# Patient Record
Sex: Female | Born: 1970 | Race: White | Hispanic: Yes | Marital: Married | Smoking: Never smoker
Health system: Southern US, Community
[De-identification: ages and names within clinical notes are randomized; demographics above are authoritative.]

## PROBLEM LIST (undated history)

## (undated) DIAGNOSIS — F32A Depression, unspecified: Secondary | ICD-10-CM

## (undated) DIAGNOSIS — E282 Polycystic ovarian syndrome: Secondary | ICD-10-CM

## (undated) DIAGNOSIS — K635 Polyp of colon: Secondary | ICD-10-CM

## (undated) DIAGNOSIS — F329 Major depressive disorder, single episode, unspecified: Secondary | ICD-10-CM

## (undated) HISTORY — DX: Polyp of colon: K63.5

## (undated) HISTORY — DX: Polycystic ovarian syndrome: E28.2

## (undated) HISTORY — PX: WISDOM TOOTH EXTRACTION: SHX21

---

## 1990-07-26 HISTORY — PX: CRYOTHERAPY: SHX1416

## 1991-07-27 HISTORY — PX: REDUCTION MAMMAPLASTY: SUR839

## 2005-03-19 ENCOUNTER — Inpatient Hospital Stay: Payer: Self-pay

## 2005-03-19 ENCOUNTER — Observation Stay: Payer: Self-pay

## 2007-11-28 ENCOUNTER — Ambulatory Visit: Payer: Self-pay | Admitting: Family Medicine

## 2009-05-26 ENCOUNTER — Ambulatory Visit: Payer: Self-pay

## 2009-12-15 ENCOUNTER — Encounter: Payer: Self-pay | Admitting: Otolaryngology

## 2009-12-24 ENCOUNTER — Encounter: Payer: Self-pay | Admitting: Otolaryngology

## 2010-01-23 ENCOUNTER — Encounter: Payer: Self-pay | Admitting: Otolaryngology

## 2010-06-01 ENCOUNTER — Ambulatory Visit: Payer: Self-pay

## 2011-06-07 ENCOUNTER — Ambulatory Visit: Payer: Self-pay

## 2012-06-07 ENCOUNTER — Ambulatory Visit: Payer: Self-pay

## 2015-07-27 DIAGNOSIS — K635 Polyp of colon: Secondary | ICD-10-CM

## 2015-07-27 HISTORY — DX: Polyp of colon: K63.5

## 2015-11-11 ENCOUNTER — Ambulatory Visit: Payer: Self-pay | Admitting: Physical Therapy

## 2015-11-13 ENCOUNTER — Ambulatory Visit: Payer: Managed Care, Other (non HMO) | Admitting: Physical Therapy

## 2015-11-25 ENCOUNTER — Encounter: Payer: Self-pay | Admitting: *Deleted

## 2015-11-26 ENCOUNTER — Encounter
Admission: RE | Disposition: A | Discharge status: Home / Self Care | Payer: Self-pay | Source: Ambulatory Visit | Attending: Unknown Physician Specialty

## 2015-11-26 ENCOUNTER — Ambulatory Visit: Payer: Managed Care, Other (non HMO) | Admitting: Anesthesiology

## 2015-11-26 ENCOUNTER — Ambulatory Visit
Admission: RE | Admit: 2015-11-26 | Discharge: 2015-11-26 | Disposition: A | Discharge status: Home / Self Care | Payer: Managed Care, Other (non HMO) | Source: Ambulatory Visit | Attending: Unknown Physician Specialty | Admitting: Unknown Physician Specialty

## 2015-11-26 ENCOUNTER — Encounter: Payer: Self-pay | Admitting: *Deleted

## 2015-11-26 DIAGNOSIS — Z800 Family history of malignant neoplasm of digestive organs: Secondary | ICD-10-CM | POA: Diagnosis not present

## 2015-11-26 DIAGNOSIS — D123 Benign neoplasm of transverse colon: Secondary | ICD-10-CM | POA: Insufficient documentation

## 2015-11-26 DIAGNOSIS — K529 Noninfective gastroenteritis and colitis, unspecified: Secondary | ICD-10-CM | POA: Insufficient documentation

## 2015-11-26 DIAGNOSIS — Z1211 Encounter for screening for malignant neoplasm of colon: Secondary | ICD-10-CM | POA: Insufficient documentation

## 2015-11-26 DIAGNOSIS — K633 Ulcer of intestine: Secondary | ICD-10-CM | POA: Insufficient documentation

## 2015-11-26 HISTORY — DX: Depression, unspecified: F32.A

## 2015-11-26 HISTORY — DX: Major depressive disorder, single episode, unspecified: F32.9

## 2015-11-26 HISTORY — PX: COLONOSCOPY WITH PROPOFOL: SHX5780

## 2015-11-26 SURGERY — COLONOSCOPY WITH PROPOFOL
Anesthesia: General

## 2015-11-26 MED ORDER — SODIUM CHLORIDE 0.9 % IV SOLN
INTRAVENOUS | Status: DC
  Administered 2015-11-26: 09:00:00 via INTRAVENOUS

## 2015-11-26 MED ORDER — MIDAZOLAM HCL 2 MG/2ML IJ SOLN
INTRAMUSCULAR | Status: DC | PRN
  Administered 2015-11-26: 1 mg via INTRAVENOUS

## 2015-11-26 MED ORDER — PROPOFOL 500 MG/50ML IV EMUL
INTRAVENOUS | Status: DC | PRN
  Administered 2015-11-26: 120 ug/kg/min via INTRAVENOUS

## 2015-11-26 MED ORDER — FENTANYL CITRATE (PF) 100 MCG/2ML IJ SOLN
INTRAMUSCULAR | Status: DC | PRN
  Administered 2015-11-26: 50 ug via INTRAVENOUS

## 2015-11-26 MED ORDER — SODIUM CHLORIDE 0.9 % IV SOLN: INTRAVENOUS | Status: DC

## 2015-11-26 MED ORDER — EPHEDRINE SULFATE 50 MG/ML IJ SOLN
INTRAMUSCULAR | Status: DC | PRN
  Administered 2015-11-26 (×2): 5 mg via INTRAVENOUS

## 2015-11-26 NOTE — Anesthesia Procedure Notes (Signed)
Performed by: COOK-MARTIN, Aleksis Jiggetts Pre-anesthesia Checklist: Patient identified, Emergency Drugs available, Suction available, Patient being monitored and Timeout performed Patient Re-evaluated:Patient Re-evaluated prior to inductionOxygen Delivery Method: Nasal cannula Preoxygenation: Pre-oxygenation with 100% oxygen Intubation Type: IV induction Placement Confirmation: positive ETCO2 and CO2 detector       

## 2015-11-26 NOTE — Anesthesia Postprocedure Evaluation (Signed)
Anesthesia Post Note  Patient: Christine Wade  Procedure(s) Performed: Procedure(s) (LRB): COLONOSCOPY WITH PROPOFOL (N/A)  Patient location during evaluation: Endoscopy Anesthesia Type: General Level of consciousness: awake and alert Pain management: pain level controlled Vital Signs Assessment: post-procedure vital signs reviewed and stable Respiratory status: spontaneous breathing, nonlabored ventilation, respiratory function stable and patient connected to nasal cannula oxygen Cardiovascular status: blood pressure returned to baseline and stable Postop Assessment: no signs of nausea or vomiting Anesthetic complications: no    Last Vitals:  Filed Vitals:   11/26/15 1023 11/26/15 1033  BP: 116/70 124/68  Pulse: 73 97  Temp:    Resp: 16 21    Last Pain: There were no vitals filed for this visit.               Martha Clan

## 2015-11-26 NOTE — H&P (Signed)
   Primary Care Physician:  Dalia Heading, CNM Primary Gastroenterologist:  Dr. Vira Agar  Pre-Procedure History & Physical: HPI:  Christine Wade is a 45 y.o. female is here for an colonoscopy.   Past Medical History  Diagnosis Date  . Medical history non-contributory   . Depression     Past Surgical History  Procedure Laterality Date  . No past surgeries    . Breast surgery  1993    reduction mammoplasty  . Wisdom tooth extraction      Prior to Admission medications   Medication Sig Start Date End Date Taking? Authorizing Provider  meloxicam (MOBIC) 7.5 MG tablet Take 7.5 mg by mouth daily.   Yes Historical Provider, MD  Multiple Vitamin (MULTIVITAMIN) tablet Take 1 tablet by mouth daily.   Yes Historical Provider, MD    Allergies as of 11/25/2015  . (No Known Allergies)    History reviewed. No pertinent family history.  Social History   Social History  . Marital Status: Married    Spouse Name: N/A  . Number of Children: N/A  . Years of Education: N/A   Occupational History  . Not on file.   Social History Main Topics  . Smoking status: Never Smoker   . Smokeless tobacco: Not on file  . Alcohol Use: Yes  . Drug Use: No  . Sexual Activity: Not on file   Other Topics Concern  . Not on file   Social History Narrative    Review of Systems: See HPI, otherwise negative ROS  Physical Exam: BP 115/60 mmHg  Pulse 80  Temp(Src) 98.5 F (36.9 C) (Tympanic)  Resp 14  Ht 5\' 6"  (1.676 m)  Wt 67.132 kg (148 lb)  BMI 23.90 kg/m2  SpO2 100%  LMP  General:   Alert,  pleasant and cooperative in NAD Head:  Normocephalic and atraumatic. Neck:  Supple; no masses or thyromegaly. Lungs:  Clear throughout to auscultation.    Heart:  Regular rate and rhythm. Abdomen:  Soft, nontender and nondistended. Normal bowel sounds, without guarding, and without rebound.   Neurologic:  Alert and  oriented x4;  grossly normal neurologically.  Impression/Plan: Christine Wade is here for an colonoscopy to be performed for screening, family hx of colon cancer in father and colon polyps in twin sister.  Risks, benefits, limitations, and alternatives regarding  colonoscopy have been reviewed with the patient.  Questions have been answered.  All parties agreeable.   Gaylyn Cheers, MD  11/26/2015, 9:17 AM

## 2015-11-26 NOTE — Anesthesia Preprocedure Evaluation (Signed)
Anesthesia Evaluation  Patient identified by MRN, date of birth, ID band Patient awake    Reviewed: Allergy & Precautions, H&P , NPO status , Patient's Chart, lab work & pertinent test results, reviewed documented beta blocker date and time   History of Anesthesia Complications Negative for: history of anesthetic complications  Airway Mallampati: II  TM Distance: >3 FB Neck ROM: full    Dental no notable dental hx. (+) Caps, Missing, Teeth Intact Bridge on the top:   Pulmonary neg pulmonary ROS,    Pulmonary exam normal breath sounds clear to auscultation       Cardiovascular Exercise Tolerance: Good negative cardio ROS Normal cardiovascular exam Rhythm:regular Rate:Normal     Neuro/Psych negative neurological ROS  negative psych ROS   GI/Hepatic negative GI ROS, Neg liver ROS,   Endo/Other  negative endocrine ROS  Renal/GU negative Renal ROS  negative genitourinary   Musculoskeletal   Abdominal   Peds  Hematology negative hematology ROS (+)   Anesthesia Other Findings Past Medical History:   Medical history non-contributory                             Depression                                                   Reproductive/Obstetrics negative OB ROS                             Anesthesia Physical Anesthesia Plan  ASA: I  Anesthesia Plan: General   Post-op Pain Management:    Induction:   Airway Management Planned:   Additional Equipment:   Intra-op Plan:   Post-operative Plan:   Informed Consent: I have reviewed the patients History and Physical, chart, labs and discussed the procedure including the risks, benefits and alternatives for the proposed anesthesia with the patient or authorized representative who has indicated his/her understanding and acceptance.   Dental Advisory Given  Plan Discussed with: Anesthesiologist, CRNA and Surgeon  Anesthesia Plan Comments:          Anesthesia Quick Evaluation

## 2015-11-26 NOTE — Op Note (Signed)
[facility] Gastroenterology Patient Name: Christine Wade Procedure Date: 11/26/2015 9:18 AM MRN: [id number] Account #: [id number] Date of Birth: 1971-03-22 Admit Type: Outpatient Age: 45 Room: [facility] ENDO ROOM 4 Gender: Female Note Status: Finalized Procedure:            Colonoscopy Indications:          Screening in patient at increased risk: Family history                        of 1st-degree relative with colorectal cancer Providers:            Manya Silvas, MD Referring MD:         Jesus Genera. Danise Mina (Referring MD) Medicines:            Propofol per Anesthesia Complications:        No immediate complications. Procedure:            Pre-Anesthesia Assessment:                       - After reviewing the risks and benefits, the patient                        was deemed in satisfactory condition to undergo the                        procedure.                       After obtaining informed consent, the colonoscope was                        passed under direct vision. Throughout the procedure,                        the patient's blood pressure, pulse, and oxygen                        saturations were monitored continuously. The                        Colonoscope was introduced through the anus and                        advanced to the the cecum, identified by appendiceal                        orifice and ileocecal valve. Findings:      The distal ileum contained 2 small ulcers. No bleeding was present. No       stigmata of recent bleeding were seen. Biopsies were taken with a cold       forceps for histology.      A diffuse area of granular mucosa was found at the ileocecal valve.       Biopsies were taken with a cold forceps for histology.      A small polyp was found in the transverse colon. The polyp was sessile.       The polyp was removed with a hot snare. Resection and retrieval were       complete. One clip applied.      A small polyp was found in  the hepatic flexure. The polyp  was sessile.       The polyp was removed with a hot snare. Resection and retrieval were       complete.      A small polyp was found in the transverse colon. The polyp was sessile.       The polyp was removed with a hot snare. Resection and retrieval were       complete.      A localized area of granular mucosa was found in the descending colon.       Biopsies were taken with a cold forceps for histology. Impression:           - 2 small ulcers in the distal ileum. Biopsied.                       - Granularity at the ileocecal valve. Biopsied.                       - One small polyp in the transverse colon, removed with                        a hot snare. Resected and retrieved.                       - One small polyp at the hepatic flexure, removed with                        a hot snare. Resected and retrieved.                       - One small polyp in the transverse colon, removed with                        a hot snare. Resected and retrieved.                       - Granularity in the descending colon. Biopsied. Recommendation:       - Await pathology results. Manya Silvas, MD 11/26/2015 10:03:44 AM This report has been signed electronically. Number of Addenda: 0 Note Initiated On: 11/26/2015 9:18 AM Scope Withdrawal Time: 0 hours 24 minutes 25 seconds  Total Procedure Duration: 0 hours 32 minutes 35 seconds       [facility]

## 2015-11-26 NOTE — Transfer of Care (Signed)
Immediate Anesthesia Transfer of Care Note  Patient: Christine Wade  Procedure(s) Performed: Procedure(s): COLONOSCOPY WITH PROPOFOL (N/A)  Patient Location: PACU  Anesthesia Type:General  Level of Consciousness: awake, alert  and sedated  Airway & Oxygen Therapy: Patient Spontanous Breathing and Patient connected to nasal cannula oxygen  Post-op Assessment: Report given to RN  Post vital signs: Reviewed  Last Vitals:  Filed Vitals:   11/26/15 0825  BP: 115/60  Pulse: 80  Temp: 36.9 C  Resp: 14    Last Pain: There were no vitals filed for this visit.       Complications: No apparent anesthesia complications

## 2015-11-28 ENCOUNTER — Encounter: Payer: Self-pay | Admitting: Unknown Physician Specialty

## 2015-11-28 LAB — SURGICAL PATHOLOGY

## 2016-10-13 ENCOUNTER — Ambulatory Visit: Payer: Self-pay | Admitting: Certified Nurse Midwife

## 2016-11-01 ENCOUNTER — Encounter: Payer: Self-pay | Admitting: Certified Nurse Midwife

## 2016-11-01 ENCOUNTER — Ambulatory Visit (INDEPENDENT_AMBULATORY_CARE_PROVIDER_SITE_OTHER): Payer: BLUE CROSS/BLUE SHIELD | Admitting: Certified Nurse Midwife

## 2016-11-01 VITALS — BP 110/70 | HR 81 | Ht 66.0 in | Wt 150.0 lb

## 2016-11-01 DIAGNOSIS — F320 Major depressive disorder, single episode, mild: Secondary | ICD-10-CM | POA: Diagnosis not present

## 2016-11-01 DIAGNOSIS — Z124 Encounter for screening for malignant neoplasm of cervix: Secondary | ICD-10-CM | POA: Diagnosis not present

## 2016-11-01 DIAGNOSIS — Z01419 Encounter for gynecological examination (general) (routine) without abnormal findings: Secondary | ICD-10-CM

## 2016-11-01 DIAGNOSIS — F32A Depression, unspecified: Secondary | ICD-10-CM

## 2016-11-01 NOTE — Progress Notes (Signed)
Gynecology Annual Exam  PCP: Dalia Heading, CNM  Chief Complaint:  Chief Complaint  Patient presents with  . Gynecologic Exam    History of Present Illness Christine Wade is a 46 year old  Hispanic or Latino (from [place] female, G5 Z6109, who presents for her annual exam. She is having problems with anxiety, depression, and insomnia x 6 months. This began after the death of her father in May 30, 2023. She also stopped home schooling around the same time. She states she does better when her day is organized and purposeful. Has done some teacher substitution on occasion. Has a past history of depression and was prescribed a antidepressant by her PCP. She stopped it after a while because it made her "feel numb." She does not want to take any medications. She has never seen a counselor for depression.   Her menses are regular and her LMP was 10/24/2016 . They occur every 28 days , they last 5 days , are medium flow, and are without clots.  Her last menses was  A little heavier and painful than normal. She has had no spotting.  She reports mild dysmenorrhea.   The patient's past medical history is detailed in the past medical history section.  Since her last annual GYN exam dated 10/03/2015, she has had a colonoscopy May 2017 which revealed colon polyps and ileitis. She is to have her next colonoscopy at age 28. She has continued to have some problems with knee pain. She does do stretches to help with her knee pain. She is sexually active. Husband having some problems with ED. She is currently using a vasectomy for contraception.  Her most recent pap smear was obtained 10/03/2015 and was NIL/negative HRHPV  Her most recent mammogram obtained on 10/03/2015 was benign and revealed no significant changes.  There is no family history of breast cancer.  There is no family history of ovarian cancer. Her father was diagnosed with colon cancer at age 70. Her twin sister had colonoscopies for bowel  changes x2 and had polyps. The patient does not do self breast exams.  The patient does not smoke.  The patient does drink occasionally.  The patient does not use illegal drugs.  The patient exercises on the elliptical trainer 3 x/week.  The patient does not get adequate calcium in her diet.  She had a recent cholesterol screen last year which was normal.    Review of Systems: Review of Systems  Constitutional: Negative for chills, fever and weight loss.  HENT: Negative for congestion, sinus pain and sore throat.   Eyes: Negative for blurred vision and pain.  Respiratory: Negative for hemoptysis, shortness of breath and wheezing.   Cardiovascular: Negative for chest pain, palpitations and leg swelling.  Gastrointestinal: Negative for abdominal pain, blood in stool, diarrhea, heartburn, nausea and vomiting.  Genitourinary: Negative for dysuria, frequency, hematuria and urgency.       Negative for incontinence, irregular menses.  Musculoskeletal: Negative for back pain, joint pain and myalgias.  Skin: Negative for itching and rash.  Neurological: Negative for dizziness, tingling and headaches.  Endo/Heme/Allergies: Negative for environmental allergies and polydipsia. Does not bruise/bleed easily.       Negative for hirsutism and hot flashes   Psychiatric/Behavioral: Positive for depression. The patient is nervous/anxious and has insomnia.      Past Medical History:  Past Medical History:  Diagnosis Date  . Colon polyps 2017  . Depression   . Polycystic ovaries     Past  Surgical History:  Past Surgical History:  Procedure Laterality Date  . BREAST SURGERY  1993   reduction mammoplasty  . COLONOSCOPY WITH PROPOFOL N/A 11/26/2015   Procedure: COLONOSCOPY WITH PROPOFOL;  Surgeon: Manya Silvas, MD;  Location: [facility] ENDOSCOPY;  Service: Endoscopy;  Laterality: N/A;  . CRYOTHERAPY  1992  . WISDOM TOOTH EXTRACTION      Medications: calcium with vitamin D3, vitamin for skin,  hair, nails  Allergies:  No Known Allergies  Gynecologic History: Remote history of abnormal Pap smears with cryosurgery 1992 Obstetric History: 4 vaginal deliveries  Social History:  Social History   Social History  . Marital status: Married    Spouse name: N/A  . Number of children: 4  . Years of education: 101   Occupational History  . Not on file.   Social History Main Topics  . Smoking status: Never Smoker  . Smokeless tobacco: Never Used  . Alcohol use Yes  . Drug use: No  . Sexual activity: Yes    Partners: Male    Birth control/ protection: Other-see comments     Comment: vasectomy   Other Topics Concern  . Not on file   Social History Narrative  . No narrative on file    Family History:  Family History  Problem Relation Age of Onset  . Osteopenia Mother   . Colon cancer Father 10  . Colon polyps Sister   . Stomach cancer Paternal Grandfather   . Brain cancer Cousin   . Thyroid cancer Cousin      Physical Exam Vitals: BP 110/70   Pulse 81   Ht 5\' 6"  (1.676 m)   Wt 68 kg (150 lb)   LMP 10/24/2016 (Exact Date)   BMI 24.21 kg/m  Physical Exam  Constitutional: She is oriented to person, place, and time. She appears well-developed and well-nourished.  HENT:  Head: Normocephalic and atraumatic.  Neck: Normal range of motion. Neck supple. No thyromegaly present.  Cardiovascular: Normal rate and regular rhythm.   No murmur heard. Respiratory: Effort normal and breath sounds normal. She has no wheezes. She has no rales. She exhibits no tenderness. Right breast exhibits no inverted nipple, no mass, no nipple discharge, no skin change and no tenderness. Left breast exhibits no inverted nipple, no mass, no nipple discharge, no skin change and no tenderness.  s/p reduction mammoplasty  GI: Soft. She exhibits no distension and no mass. There is no tenderness. There is no rebound and no guarding.  Genitourinary: Rectum normal. Cervix exhibits no motion  tenderness, no discharge and no friability. Right adnexum displays no mass and no tenderness. Left adnexum displays no mass and no tenderness.  Genitourinary Comments: Vulva: no lesions or inflammation Vagina: Urethrocele (asymptomatic), no bleeding Cervix: friable os with Pap, NT Uterus: nonenlarged, normal contour Position: anteverted   mobile, non-tender  Adnexa: no masses, NT Rectal: external,non inflamed hemorrhoid  Musculoskeletal: Normal range of motion.  Lymphadenopathy:    She has no cervical adenopathy.    She has no axillary adenopathy.       Right: No supraclavicular adenopathy present.       Left: No supraclavicular adenopathy present.  No infraclavicular adenopathy  Neurological: She is alert and oriented to person, place, and time.  Skin: Skin is warm and dry. No rash noted.  Psychiatric: She has a normal mood and affect. Her behavior is normal. Judgment and thought content normal.     Assessment: 46 y.o.  well woman exam Mild depression/ anxiety  surrounding death of father and life changes ( no longer home schooling)   Plan: 1. Screening for cervical cancer: PAp done. 2) screening for breast cancer: Patient to schedule annual screening mammogram at [facility]. Encouraged self breast exams. 3). Next colonoscopy in 2 more years. 4) Follow up in 1 year for annual. 5). Routine screening for cholesterol and diabetes at age 44. Dalia Heading, CNM  11/01/2016 1:03 PM

## 2016-11-01 NOTE — Progress Notes (Deleted)
     Gynecology Annual Exam  PCP: Dalia Heading, CNM  Chief Complaint:  Chief Complaint  Patient presents with  . Gynecologic Exam   Review of Systems: ROS  Past Medical History:  Past Medical History:  Diagnosis Date  . Depression   . Polycystic ovaries     Past Surgical History:  Past Surgical History:  Procedure Laterality Date  . BREAST SURGERY  1993   reduction mammoplasty  . COLONOSCOPY WITH PROPOFOL N/A 11/26/2015   Procedure: COLONOSCOPY WITH PROPOFOL;  Surgeon: Manya Silvas, MD;  [facility] ENDOSCOPY;  Service: Endoscopy;  Laterality: N/A;  . CRYOTHERAPY  1992  . WISDOM TOOTH EXTRACTION      Gynecologic History:  Patient's last menstrual period was 10/24/2016 (exact date). Contraception: {method:5051}  Family History:  Family History  Problem Relation Age of Onset  . Osteopenia Mother   . Colon cancer Father   . Colon polyps Sister   . Stomach cancer Paternal Grandfather   . Brain cancer Cousin   . Thyroid cancer Cousin     Social History:  Social History   Social History  . Marital status: Married    Spouse name: N/A  . Number of children: N/A  . Years of education: N/A   Occupational History  . Not on file.   Social History Main Topics  . Smoking status: Never Smoker  . Smokeless tobacco: Never Used  . Alcohol use Yes  . Drug use: No  . Sexual activity: Yes    Birth control/ protection: Other-see comments     Comment: vasectomy   Other Topics Concern  . Not on file   Social History Narrative  . No narrative on file    Allergies:  No Known Allergies  Medications: Prior to Admission medications   Medication Sig Start Date End Date Taking? Authorizing Provider  calcium-vitamin D (OSCAL WITH D) 500-200 MG-UNIT tablet Take 1 tablet by mouth.   Yes Historical Provider, MD  Multiple Vitamins-Minerals (HAIR/SKIN/NAILS) TABS Take by mouth.   Yes Historical Provider, MD    Physical Exam Vitals: Blood pressure 110/70,  pulse 81, height 5\' 6"  (1.676 m), weight 150 lb (68 kg), last menstrual period 10/24/2016. Physical Exam  @MAMMOFINDINGS @ @MAMMONEXTRECDUEDATE @     Assessment: 46 y.o. [id number] No problem-specific Assessment & Plan notes found for this encounter.   Plan: Problem List Items Addressed This Visit    None

## 2016-11-02 LAB — IGP,RFX APTIMA HPV ALL PTH: PAP SMEAR COMMENT: 0

## 2016-11-10 ENCOUNTER — Other Ambulatory Visit: Payer: Self-pay | Admitting: Certified Nurse Midwife

## 2016-11-10 DIAGNOSIS — Z1231 Encounter for screening mammogram for malignant neoplasm of breast: Secondary | ICD-10-CM

## 2016-11-17 ENCOUNTER — Ambulatory Visit
Admission: RE | Admit: 2016-11-17 | Discharge: 2016-11-17 | Disposition: A | Discharge status: Home / Self Care | Payer: BLUE CROSS/BLUE SHIELD | Source: Ambulatory Visit | Attending: Certified Nurse Midwife | Admitting: Certified Nurse Midwife

## 2016-11-17 DIAGNOSIS — Z1231 Encounter for screening mammogram for malignant neoplasm of breast: Secondary | ICD-10-CM | POA: Insufficient documentation

## 2017-11-02 ENCOUNTER — Encounter: Payer: Self-pay | Admitting: Certified Nurse Midwife

## 2017-11-02 ENCOUNTER — Ambulatory Visit (INDEPENDENT_AMBULATORY_CARE_PROVIDER_SITE_OTHER): Payer: BLUE CROSS/BLUE SHIELD | Admitting: Certified Nurse Midwife

## 2017-11-02 VITALS — BP 110/70 | HR 64 | Ht 66.0 in | Wt 151.0 lb

## 2017-11-02 DIAGNOSIS — D369 Benign neoplasm, unspecified site: Secondary | ICD-10-CM | POA: Insufficient documentation

## 2017-11-02 DIAGNOSIS — Z1239 Encounter for other screening for malignant neoplasm of breast: Secondary | ICD-10-CM

## 2017-11-02 DIAGNOSIS — Z124 Encounter for screening for malignant neoplasm of cervix: Secondary | ICD-10-CM | POA: Diagnosis not present

## 2017-11-02 DIAGNOSIS — Z01419 Encounter for gynecological examination (general) (routine) without abnormal findings: Secondary | ICD-10-CM

## 2017-11-02 DIAGNOSIS — Z1231 Encounter for screening mammogram for malignant neoplasm of breast: Secondary | ICD-10-CM | POA: Diagnosis not present

## 2017-11-02 DIAGNOSIS — F32A Depression, unspecified: Secondary | ICD-10-CM | POA: Insufficient documentation

## 2017-11-02 DIAGNOSIS — F329 Major depressive disorder, single episode, unspecified: Secondary | ICD-10-CM | POA: Insufficient documentation

## 2017-11-02 MED ORDER — FEXOFENADINE HCL 180 MG PO TABS: 180.0000 mg | ORAL_TABLET | Freq: Every day | ORAL | 5 refills | Status: DC

## 2017-11-02 NOTE — Progress Notes (Signed)
Gynecology Annual Exam  PCP: Dalia Heading, CNM  Chief Complaint:  Chief Complaint  Patient presents with  . Gynecologic Exam    History of Present Illness Christine Wade. Christine Wade is a 47 year old  Hispanic or Latino (from [place] female, G5 U5427, who presents for her annual exam. She has not had a menses since 09/14/2017. She denies hot flashes and night sweats. Her menses are usually monthly, last 3-4 days, with a moderate flow and are without clots. She reports mild dysmenorrhea, and occasionally will take Tylenol with relief. No intermenstrual bleeding.  She has been having some problems with depression that worsened after the death of her father in May 29, 2016. She also stopped home schooling around the same time. She states she does better when her day is organized and purposeful. Has done some teacher substitution on occasion. Has a past history of depression and was prescribed a antidepressant by her PCP. She stopped it after a while because it made her "feel numb." She does not want to take any medications. She has never seen a counselor for depression.     The patient's past medical history is detailed in the past medical history section.   She has had a colonoscopy May 2017 which revealed adenomatous colon polyps and ileitis. She is to have her next colonoscopy at age 39. She has continued to have some problems with knee pain. She does do stretches to help with her knee pain. She is not currently sexually active. Husband having some problems with ED. He has a vasectomy. Her most recent pap smear was obtained 11/01/2016 and was NIL. Remote history of abnormal Pap smears and a cryo in 1992 Her most recent mammogram obtained on 11/18/2016 was benign and revealed no significant changes.  There is no family history of breast cancer.  There is no family history of ovarian cancer. Her father was diagnosed with colon cancer at age 44. Her twin sister had colonoscopies  for bowel changes x2 and had polyps. The patient does not do self breast exams.  The patient does not smoke.  The patient does drink occasionally.  The patient does not use illegal drugs.  The patient exercises on the elliptical trainer 3 x/week.  The patient may not get adequate calcium in her diet.  She had a recent cholesterol screen 2017 which was normal.    Review of Systems: Review of Systems  Constitutional: Negative for chills, fever and weight loss.  HENT: Negative for congestion, sinus pain and sore throat.   Eyes: Negative for blurred vision and pain.  Respiratory: Negative for hemoptysis, shortness of breath and wheezing.   Cardiovascular: Negative for chest pain, palpitations and leg swelling.  Gastrointestinal: Negative for abdominal pain, blood in stool, diarrhea, heartburn, nausea and vomiting.  Genitourinary: Negative for dysuria, frequency, hematuria and urgency.       Negative for incontinence, positive for irregular menses.  Musculoskeletal: Negative for back pain, joint pain and myalgias.  Skin: Negative for itching and rash.  Neurological: Negative for dizziness, tingling and headaches.  Endo/Heme/Allergies: Positive for environmental allergies (and sneezing recently). Negative for polydipsia. Does not bruise/bleed easily.       Negative for hirsutism and hot flashes   Psychiatric/Behavioral: Positive for depression. The patient is nervous/anxious and has insomnia.      Past Medical History:  Past Medical History:  Diagnosis Date  . Colon polyps 2017   adenomatous colon polyps  . Depression   .  Polycystic ovaries     Past Surgical History:  Past Surgical History:  Procedure Laterality Date  . COLONOSCOPY WITH PROPOFOL N/A 11/26/2015   Procedure: COLONOSCOPY WITH PROPOFOL;  Surgeon: Manya Silvas, MD;  Location: [facility] ENDOSCOPY;  Service: Endoscopy;  Laterality: N/A;  . CRYOTHERAPY  1992  . REDUCTION MAMMAPLASTY  1993  . WISDOM TOOTH EXTRACTION       Medications: none  Allergies:  No Known Allergies  Gynecologic History: Remote history of abnormal Pap smears with cryosurgery 1992  Obstetric History: 4 vaginal deliveries  Social History:  Social History   Socioeconomic History  . Marital status: Married    Spouse name: Not on file  . Number of children: 4  . Years of education: 18  . Highest education level: Not on file  Occupational History  . Occupation: Data processing manager  Social Needs  . Financial resource strain: Not on file  . Food insecurity:    Worry: Not on file    Inability: Not on file  . Transportation needs:    Medical: Not on file    Non-medical: Not on file  Tobacco Use  . Smoking status: Never Smoker  . Smokeless tobacco: Never Used  Substance and Sexual Activity  . Alcohol use: Yes  . Drug use: No  . Sexual activity: Not Currently    Partners: Male    Birth control/protection: Other-see comments    Comment: vasectomy  Lifestyle  . Physical activity:    Days per week: 3 days    Minutes per session: 30 min  . Stress: Only a little  Relationships  . Social connections:    Talks on phone: Not on file    Gets together: Not on file    Attends religious service: Not on file    Active member of club or organization: Not on file    Attends meetings of clubs or organizations: Not on file    Relationship status: Not on file  . Intimate partner violence:    Fear of current or ex partner: Not on file    Emotionally abused: Not on file    Physically abused: Not on file    Forced sexual activity: Not on file  Other Topics Concern  . Not on file  Social History Narrative  . Not on file    Family History:  Family History  Problem Relation Age of Onset  . Osteopenia Mother   . Colon cancer Father 83  . Colon polyps Sister   . Brain cancer Cousin 25       maternal cousin  . Thyroid cancer Cousin 104  . Stomach cancer Maternal Grandfather   . Breast cancer Neg Hx      Physical  Exam Vitals: BP 110/70   Pulse 64   Ht 5\' 6"  (1.676 m)   Wt 151 lb (68.5 kg)   LMP 09/14/2017 (Approximate)   BMI 24.37 kg/m  Physical Exam  Constitutional: She is oriented to person, place, and time. She appears well-developed and well-nourished.  HENT:  Head: Normocephalic and atraumatic.  Neck: Normal range of motion. Neck supple. No thyromegaly present.  Cardiovascular: Normal rate and regular rhythm.  No murmur heard. Respiratory: Effort normal and breath sounds normal. She has no wheezes. She has no rales. She exhibits no tenderness. Right breast exhibits no inverted nipple, no mass, no nipple discharge, no skin change and no tenderness. Left breast exhibits no inverted nipple, no mass, no nipple discharge, no skin change and no tenderness.  s/p reduction mammoplasty  GI: Soft. She exhibits no distension and no mass. There is no tenderness. There is no rebound and no guarding.  Genitourinary:  Genitourinary Comments: Vulva: no lesions or inflammation Vagina: Urethrocele (asymptomatic), no bleeding Cervix: no masses, lesions, NT Uterus: AV, NSSC, mobile, NT Adnexa: no masses, NT Rectal: external,non inflamed hemorrhoid  Musculoskeletal: Normal range of motion.  Lymphadenopathy:    She has no cervical adenopathy.    She has no axillary adenopathy.       Right: No supraclavicular adenopathy present.       Left: No supraclavicular adenopathy present.  No infraclavicular adenopathy  Neurological: She is alert and oriented to person, place, and time.  Skin: Skin is warm and dry. No rash noted.  Psychiatric: She has a normal mood and affect. Her behavior is normal. Judgment and thought content normal.     Assessment: 47 y.o.  well woman exam Mild depression Perimenopausal bleeding   Plan: 1. Screening for cervical cancer: PAP done. 2) Screening for breast cancer: Patient to schedule annual screening mammogram at [facility]. Encouraged self breast exams. 3). Next  colonoscopy in 2020 4) Follow up in 1 year for annual. 5). Routine screening for cholesterol and diabetes at age 51. 34) Prevention of osteoporosis discussed with adequate intake of calcium and vitamin D3. Also discussed the importance of strength training/exercise in preventing osteoporosis 7) Anticipatory guidance regarding menopause symptoms and their treatment.   Dalia Heading, CNM   11/02/2017

## 2017-11-02 NOTE — Addendum Note (Signed)
Addended by: Dalia Heading on: 11/02/2017 05:39 PM   Modules accepted: Orders

## 2017-11-07 LAB — IGP,RFX APTIMA HPV ALL PTH: PAP Smear Comment: 0

## 2017-11-28 ENCOUNTER — Ambulatory Visit
Admission: RE | Admit: 2017-11-28 | Discharge: 2017-11-28 | Disposition: A | Discharge status: Home / Self Care | Payer: BLUE CROSS/BLUE SHIELD | Source: Ambulatory Visit | Attending: Certified Nurse Midwife | Admitting: Certified Nurse Midwife

## 2017-11-28 DIAGNOSIS — Z1231 Encounter for screening mammogram for malignant neoplasm of breast: Secondary | ICD-10-CM | POA: Diagnosis present

## 2017-11-28 DIAGNOSIS — Z1239 Encounter for other screening for malignant neoplasm of breast: Secondary | ICD-10-CM

## 2018-11-17 ENCOUNTER — Ambulatory Visit: Payer: BLUE CROSS/BLUE SHIELD | Admitting: Certified Nurse Midwife

## 2019-01-04 ENCOUNTER — Other Ambulatory Visit: Payer: Self-pay | Admitting: Certified Nurse Midwife

## 2019-01-04 DIAGNOSIS — Z1231 Encounter for screening mammogram for malignant neoplasm of breast: Secondary | ICD-10-CM

## 2019-01-11 ENCOUNTER — Other Ambulatory Visit: Payer: Self-pay

## 2019-01-11 ENCOUNTER — Ambulatory Visit
Admission: RE | Admit: 2019-01-11 | Discharge: 2019-01-11 | Disposition: A | Discharge status: Home / Self Care | Payer: BC Managed Care – PPO | Source: Ambulatory Visit | Attending: Certified Nurse Midwife | Admitting: Certified Nurse Midwife

## 2019-01-11 ENCOUNTER — Encounter (INDEPENDENT_AMBULATORY_CARE_PROVIDER_SITE_OTHER): Payer: Self-pay

## 2019-01-11 DIAGNOSIS — Z1231 Encounter for screening mammogram for malignant neoplasm of breast: Secondary | ICD-10-CM | POA: Diagnosis not present

## 2019-01-12 ENCOUNTER — Encounter: Payer: Self-pay | Admitting: Certified Nurse Midwife

## 2019-01-12 ENCOUNTER — Ambulatory Visit (INDEPENDENT_AMBULATORY_CARE_PROVIDER_SITE_OTHER): Payer: BC Managed Care – PPO | Admitting: Certified Nurse Midwife

## 2019-01-12 VITALS — BP 100/66 | HR 90 | Ht 66.0 in | Wt 157.0 lb

## 2019-01-12 DIAGNOSIS — Z23 Encounter for immunization: Secondary | ICD-10-CM | POA: Diagnosis not present

## 2019-01-12 DIAGNOSIS — Z01419 Encounter for gynecological examination (general) (routine) without abnormal findings: Secondary | ICD-10-CM

## 2019-01-12 DIAGNOSIS — Z124 Encounter for screening for malignant neoplasm of cervix: Secondary | ICD-10-CM

## 2019-01-12 NOTE — Patient Instructions (Signed)
Preventing Osteoporosis, Adult  Osteoporosis is a condition that causes the bones to get weaker. With osteoporosis, the bones become thinner, and the normal spaces in bone tissue become larger. This can make the bones weak and cause them to break more easily. People who have osteoporosis are more likely to break their wrist, spine, or hip. Even a minor accident or injury can be enough to break weak bones.  Osteoporosis can occur with aging. Your body constantly replaces old bone tissue with new tissue. As you get older, you may lose bone tissue more quickly, or it may be replaced more slowly. Osteoporosis is more likely to develop if you have poor nutrition or do not get enough calcium or vitamin D. Other lifestyle factors can also play a role. By making some diet and lifestyle changes, you can help to keep your bones healthy and help to prevent osteoporosis.  What nutrition changes can be made?  Nutrition plays an important role in maintaining healthy, strong bones.  · Make sure you get enough calcium every day from food or from calcium supplements.  ? If you are age 50 or younger, aim to get 1,000 mg of calcium every day.  ? If you are older than age 50, aim to get 1,200 mg of calcium every day.  · Try to get enough vitamin D every day.  ? If you are age 70 or younger, aim to get 600 international units (IU) every day.  ? If you are older than age 70, aim to get 800 international units every day.  · Follow a healthy diet. Eat plenty of foods that contain calcium and vitamin D.  ? Calcium is in milk, cheese, yogurt, and other dairy products. Some fish and vegetables are also good sources of calcium. Many foods such as cereals and breads have had calcium added to them (are fortified). Check nutrition labels to see how much calcium is in a food or drink.  ? Foods that contain vitamin D include milk, cereals, salmon, and tuna. Your body also makes vitamin D when you are out in the sun. Bare skin exposure to the sun on  your face, arms, legs, or back for no more than 30 minutes a day, 2 times per week is more than enough. Beyond that, it is important to use sunblock to protect your skin from sunburn, which increases your risk for skin cancer.  What lifestyle changes can be made?  Making changes in your everyday life can also play an important role in preventing osteoporosis.  · Stay active and get exercise every day. Ask your health care provider what types of exercise are best for you.  · Do not use any products that contain nicotine or tobacco, such as cigarettes and e-cigarettes. If you need help quitting, ask your health care provider.  · Limit alcohol intake to no more than 1 drink a day for nonpregnant women and 2 drinks a day for men. One drink equals 12 oz of beer, 5 oz of wine, or 1½ oz of hard liquor.  Why are these changes important?  Making these nutrition and lifestyle changes can:  · Help you develop and maintain healthy, strong bones.  · Prevent loss of bone mass and the problems that are caused by that loss, such as broken bones and delayed healing.  · Make you feel better mentally and physically.  What can happen if changes are not made?  Problems that can result from osteoporosis can be very serious. These   may include:  · A higher risk of broken bones that are painful and do not heal well.  · Physical malformations, such as a collapsed spine or a hunched back.  · Problems with movement.  Where to find support  If you need help making changes to prevent osteoporosis, talk with your health care provider. You can ask for a referral to a diet and nutrition specialist (dietitian) and a physical therapist.  Where to find more information  Learn more about osteoporosis from:  · NIH Osteoporosis and Related Bone Diseases National Resource Center: www.niams.nih.gov/health_info/bone/osteoporosis/osteoporosis_ff.asp  · U.S. Office on Women’s Health:  www.womenshealth.gov/publications/our-publications/fact-sheet/osteoporosis.html  · National Osteoporosis Foundation: www.nof.org/patients/what-is-osteoporosis/  Summary  · Osteoporosis is a condition that causes weak bones that are more likely to break.  · Eating a healthy diet and making sure you get enough calcium and vitamin D can help prevent osteoporosis.  · Other ways to reduce your risk of osteoporosis include getting regular exercise and avoiding alcohol and products that contain nicotine or tobacco.  This information is not intended to replace advice given to you by your health care provider. Make sure you discuss any questions you have with your health care provider.  Document Released: 07/27/2015 Document Revised: 04/19/2017 Document Reviewed: 03/22/2016  Elsevier Interactive Patient Education © 2019 Elsevier Inc.

## 2019-01-12 NOTE — Progress Notes (Signed)
Gynecology Annual Exam  PCP: Dalia Heading, CNM  Chief Complaint:  Chief Complaint  Patient presents with  . Gynecologic Exam    No complaints    History of Present Illness Christine Wade is a 48 year old  Hispanic or Latino (from [place] female, G5 T7017, who presents for her annual exam. She has not had a menses since 09/14/2017. She is having more hot flashes and has had more problems with insomnia. Has been taking melatonin to help her fall asleep. Usually sleeps from 11 PM to 0530 AM. She has been taking two 15 minute power naps during the day.   She has been having some problems with depression that worsened after the death of her father in 2016-06-17.  Has a past history of depression and was prescribed a antidepressant by her PCP in the past. She resumed taking an antidepressant last year and currently is taking Prozac 10 mgm daily. Usually takes this at hs. Advised to start taking this in the AM to see if improves sleep.   The patient's past medical history is detailed in the past medical history section.   She has had a colonoscopy May 2017 which revealed adenomatous colon polyps and ileitis. She is to have her next colonoscopy 01/23/2019. She has continued to have some problems with knee pain. She does do stretches to help with her knee pain and walks almost every day. She is not currently sexually active. Husband having some problems with ED. He has a vasectomy. Her most recent pap smear was obtained 11/02/2017 and was NIL. Remote history of abnormal Pap smears and a cryo in 1992 Her most recent mammogram obtained on 11/28/2017 was negative.  Results from mammogram yesterday are still pending. There is no family history of breast cancer.  There is no family history of ovarian cancer. Her father was diagnosed with colon cancer at age 70. Her twin sister had colonoscopies for bowel changes x2 and had polyps. The patient does not do self breast exams.   The patient does not smoke.  The patient does drink occasionally (usually 4-8 oz wine/week).  The patient does not use illegal drugs.  The patient exercises by walking most days of the week. The patient may not get adequate calcium in her diet.  She had a recent cholesterol screen 2017 which was normal.    Review of Systems: Review of Systems  Constitutional: Negative for chills, fever and weight loss.  HENT: Negative for congestion, sinus pain and sore throat.   Eyes: Negative for blurred vision and pain.  Respiratory: Negative for hemoptysis, shortness of breath and wheezing.   Cardiovascular: Negative for chest pain, palpitations and leg swelling.  Gastrointestinal: Negative for abdominal pain, blood in stool, diarrhea, heartburn, nausea and vomiting.  Genitourinary: Negative for dysuria, frequency, hematuria and urgency.       Negative for incontinence, positive for irregular menses.  Musculoskeletal: Positive for joint pain (knee pain). Negative for back pain and myalgias.  Skin: Negative for itching and rash.  Neurological: Negative for dizziness, tingling and headaches.  Endo/Heme/Allergies: Positive for environmental allergies (and sneezing recently). Negative for polydipsia. Does not bruise/bleed easily.       Positive for  hot flashes   Psychiatric/Behavioral: Positive for depression. The patient has insomnia. The patient is not nervous/anxious.      Past Medical History:  Past Medical History:  Diagnosis Date  . Colon polyps 2017   adenomatous colon  polyps  . Depression   . Polycystic ovaries     Past Surgical History:  Past Surgical History:  Procedure Laterality Date  . COLONOSCOPY WITH PROPOFOL N/A 11/26/2015   Procedure: COLONOSCOPY WITH PROPOFOL;  Surgeon: Manya Silvas, MD;  Location: [facility] ENDOSCOPY;  Service: Endoscopy;  Laterality: N/A;  . CRYOTHERAPY  1992  . REDUCTION MAMMAPLASTY  1993  . WISDOM TOOTH EXTRACTION      Medications: none   Allergies:  No Known Allergies  Gynecologic History: Remote history of abnormal Pap smears with cryosurgery 1992  Obstetric History: 4 vaginal deliveries  Social History:  Social History   Socioeconomic History  . Marital status: Married    Spouse name: Not on file  . Number of children: 4  . Years of education: 29  . Highest education level: Not on file  Occupational History  . Occupation: Data processing manager  Social Needs  . Financial resource strain: Not on file  . Food insecurity    Worry: Not on file    Inability: Not on file  . Transportation needs    Medical: Not on file    Non-medical: Not on file  Tobacco Use  . Smoking status: Never Smoker  . Smokeless tobacco: Never Used  Substance and Sexual Activity  . Alcohol use: Yes    Alcohol/week: 2.0 standard drinks    Types: 2 Glasses of wine per week  . Drug use: No  . Sexual activity: Not Currently    Partners: Male    Birth control/protection: Other-see comments    Comment: vasectomy  Lifestyle  . Physical activity    Days per week: 7 days    Minutes per session: 60 min  . Stress: Only a little  Relationships  . Social Herbalist on phone: Not on file    Gets together: Not on file    Attends religious service: Not on file    Active member of club or organization: Not on file    Attends meetings of clubs or organizations: Not on file    Relationship status: Not on file  . Intimate partner violence    Fear of current or ex partner: Not on file    Emotionally abused: Not on file    Physically abused: Not on file    Forced sexual activity: Not on file  Other Topics Concern  . Not on file  Social History Narrative  . Not on file    Family History:  Family History  Problem Relation Age of Onset  . Osteopenia Mother   . Colon cancer Father 63  . Colon polyps Sister   . Brain cancer Cousin 25       maternal cousin  . Thyroid cancer Cousin 60  . Stomach cancer Maternal Grandfather   .  Breast cancer Neg Hx      Physical Exam Vitals: BP 100/66 (BP Location: Right Arm, Patient Position: Sitting, Cuff Size: Normal)   Pulse 90   Ht 5\' 6"  (1.676 m)   Wt 157 lb (71.2 kg)   LMP  (LMP Unknown)   BMI 25.34 kg/m  Physical Exam  Constitutional: She is oriented to person, place, and time. She appears well-developed and well-nourished.  HENT:  Head: Normocephalic and atraumatic.  Neck: Normal range of motion. Neck supple. No thyromegaly present.  Cardiovascular: Normal rate and regular rhythm.  No murmur heard. Respiratory: Effort normal and breath sounds normal. She has no wheezes. She has no rales. She exhibits no  tenderness. Right breast exhibits no inverted nipple, no mass, no nipple discharge, no skin change and no tenderness. Left breast exhibits no inverted nipple, no mass, no nipple discharge, no skin change and no tenderness.  s/p reduction mammoplasty  GI: Soft. She exhibits no distension and no mass. There is no abdominal tenderness. There is no rebound and no guarding.  Genitourinary:    Genitourinary Comments: Vulva: no lesions or inflammation Vagina: Urethrocele (asymptomatic), no bleeding Cervix: no masses, lesions, NT Uterus: AV, NSSC, mobile, NT Adnexa: no masses, NT Rectal: external,non inflamed hemorrhoid   Musculoskeletal: Normal range of motion.  Lymphadenopathy:    She has no cervical adenopathy.    She has no axillary adenopathy.       Right: No supraclavicular adenopathy present.       Left: No supraclavicular adenopathy present.  No infraclavicular adenopathy  Neurological: She is alert and oriented to person, place, and time.  Skin: Skin is warm and dry. No rash noted.  Psychiatric: She has a normal mood and affect. Her behavior is normal. Judgment and thought content normal.     Assessment: 48 y.o.  well woman exam Mild depression Vasomotor symptoms of menopause   Plan: 1. Screening for cervical cancer: PAP done. 2) Screening for breast  cancer: Patient to continue annual screening mammograms at [facility]. Mammogram results from yesterday was negative. Encouraged self breast exams. 3). Colonoscopy scheduled for 01/23/2019 4) Follow up in 1 year for annual. 5). Routine screening for cholesterol and diabetes due in 2022. TDAP given today. 6) Prevention of osteoporosis discussed with adequate intake of calcium and vitamin D3. Also discussed the importance of strength training/exercise in preventing osteoporosis 7)  Explained OTC and prescription treatment for vasomotor symptoms.    Dalia Heading, CNM

## 2019-01-12 NOTE — Progress Notes (Signed)
Patient past due for TDAP. Given IM Right Deltoid. Patient tolerated well.

## 2019-01-18 LAB — IGP, APTIMA HPV: HPV Aptima: NEGATIVE

## 2019-01-19 ENCOUNTER — Encounter: Payer: Self-pay | Admitting: Certified Nurse Midwife

## 2019-01-23 HISTORY — PX: COLONOSCOPY W/ BIOPSIES: SHX1374

## 2020-01-16 ENCOUNTER — Ambulatory Visit (INDEPENDENT_AMBULATORY_CARE_PROVIDER_SITE_OTHER): Payer: BC Managed Care – PPO | Admitting: Certified Nurse Midwife

## 2020-01-16 ENCOUNTER — Other Ambulatory Visit: Payer: Self-pay

## 2020-01-16 ENCOUNTER — Encounter: Payer: Self-pay | Admitting: Certified Nurse Midwife

## 2020-01-16 VITALS — BP 112/70 | HR 84 | Resp 18 | Ht 66.0 in | Wt 162.4 lb

## 2020-01-16 DIAGNOSIS — Z131 Encounter for screening for diabetes mellitus: Secondary | ICD-10-CM | POA: Diagnosis not present

## 2020-01-16 DIAGNOSIS — Z1231 Encounter for screening mammogram for malignant neoplasm of breast: Secondary | ICD-10-CM | POA: Diagnosis not present

## 2020-01-16 DIAGNOSIS — Z13820 Encounter for screening for osteoporosis: Secondary | ICD-10-CM

## 2020-01-16 DIAGNOSIS — Z13220 Encounter for screening for lipoid disorders: Secondary | ICD-10-CM | POA: Diagnosis not present

## 2020-01-16 DIAGNOSIS — Z01419 Encounter for gynecological examination (general) (routine) without abnormal findings: Secondary | ICD-10-CM

## 2020-01-16 MED ORDER — RHO D IMMUNE GLOBULIN 1500 UNIT/2ML IJ SOSY: 300.0000 ug | PREFILLED_SYRINGE | Freq: Once | INTRAMUSCULAR | Status: DC

## 2020-01-16 NOTE — Progress Notes (Signed)
Gynecology Annual Exam  PCP: Dalia Heading, CNM  Chief Complaint:  Chief Complaint  Patient presents with  . Gynecologic Exam    History of Present Illness Christine Wade is a 49 year old  Hispanic or Latino (from [place] female, G5 [id number], who presents for her annual exam. She has not had a menses since 09/14/2017. She is having hot flashes, usually during the day.    She has been having some problems with depression that worsened after the death of her father in June 11, 2016. She is currently taking Prozac 10 mgm and is followed by her PCP. Since her last annual 01/12/2019, she had a Covid infection in January 2021, but now has her Covid vaccine Levan Hurst)   She has had a colonoscopy May 2017 which revealed adenomatous colon polyps and ileitis. A colonoscopy 01/23/2019 was positive for hyperplastic polyps and her next colonoscopy is in 2025.   She has continued to have some problems with joint pain. She does do stretches to help with her knee pain and walks almost every day. Has laso been having problems with left plantar fsciitis.  She is not currently sexually active. Husband having some problems with ED. He has a vasectomy. Her most recent pap smear was obtained 01/12/2019 and was NIL/ negative HRHPV. Remote history of abnormal Pap smears and a cryo in 1992 Her most recent mammogram obtained on 01/11/2019 was negative.  There is no family history of breast cancer.  There is no family history of ovarian cancer. Her father was diagnosed with colon cancer at age 76. Her twin sister had colonoscopies for bowel changes x2 and had polyps. The patient does not do self breast exams.  SHe has not had a DEXA. Her mother had low bone mass and did not have a hip fracture. The patient does not smoke.  The patient does drink occasionally (usually 4-8 oz wine/week).  The patient does not use illegal drugs.  The patient exercises by walking 1 mile  most days of the  week. The patient may not get adequate calcium in her diet.  She had a recent cholesterol screen 2017 which was normal. She desires a lipid panel     Review of Systems: Review of Systems  Constitutional: Negative for chills, fever and weight loss.  HENT: Negative for congestion, sinus pain and sore throat.   Eyes: Negative for blurred vision and pain.  Respiratory: Negative for hemoptysis, shortness of breath and wheezing.   Cardiovascular: Negative for chest pain, palpitations and leg swelling.  Gastrointestinal: Negative for abdominal pain, blood in stool, diarrhea, heartburn, nausea and vomiting.  Genitourinary: Negative for dysuria, frequency, hematuria and urgency.       Negative for incontinence, positive for irregular menses.  Musculoskeletal: Positive for joint pain (knee pain). Negative for back pain and myalgias.  Skin: Negative for itching and rash.  Neurological: Positive for tingling. Negative for dizziness and headaches.  Endo/Heme/Allergies: Positive for environmental allergies (and sneezing recently). Negative for polydipsia. Does not bruise/bleed easily.       Positive for  hot flashes   Psychiatric/Behavioral: Positive for depression. The patient is not nervous/anxious and does not have insomnia.      Past Medical History:  Past Medical History:  Diagnosis Date  . Colon polyps 2017   adenomatous colon polyps  . Depression   . Polycystic ovaries     Past Surgical History:  Past Surgical History:  Procedure Laterality Date  .  COLONOSCOPY W/ BIOPSIES  01/23/2019   hyperplastic polyps/ next due 2025  . COLONOSCOPY WITH PROPOFOL N/A 11/26/2015   Procedure: COLONOSCOPY WITH PROPOFOL;  Surgeon: Manya Silvas, MD;  Location: [facility] ENDOSCOPY;  Service: Endoscopy;  Laterality: N/A;  . CRYOTHERAPY  1992  . REDUCTION MAMMAPLASTY  1993  . WISDOM TOOTH EXTRACTION      Medications:  Current Outpatient Medications:  .  FLUoxetine (PROZAC) 10 MG capsule, Take by  mouth., Disp: , Rfl:  .  Acetaminophen (MAPAP) 500 MG coapsule, Take by mouth., Disp: , Rfl:  .  fexofenadine (ALLEGRA) 180 MG tablet, Take 1 tablet (180 mg total) by mouth daily. (Patient not taking: Reported on 01/12/2019), Disp: 30 tablet, Rfl: 5  Allergies:  No Known Allergies  Gynecologic History: Remote history of abnormal Pap smears with cryosurgery 1992  Obstetric History: 4 vaginal deliveries  Social History:  Social History   Socioeconomic History  . Marital status: Married    Spouse name: Not on file  . Number of children: 4  . Years of education: 12  . Highest education level: Not on file  Occupational History  . Occupation: Data processing manager  Tobacco Use  . Smoking status: Never Smoker  . Smokeless tobacco: Never Used  Vaping Use  . Vaping Use: Never used  Substance and Sexual Activity  . Alcohol use: Yes    Alcohol/week: 2.0 standard drinks    Types: 2 Glasses of wine per week  . Drug use: No  . Sexual activity: Not Currently    Partners: Male    Birth control/protection: Other-see comments    Comment: vasectomy  Other Topics Concern  . Not on file  Social History Narrative  . Not on file   Social Determinants of Health   Financial Resource Strain:   . Difficulty of Paying Living Expenses:   Food Insecurity:   . Worried About Charity fundraiser in the Last Year:   . Arboriculturist in the Last Year:   Transportation Needs:   . Film/video editor (Medical):   Marland Kitchen Lack of Transportation (Non-Medical):   Physical Activity:   . Days of Exercise per Week:   . Minutes of Exercise per Session:   Stress:   . Feeling of Stress :   Social Connections:   . Frequency of Communication with Friends and Family:   . Frequency of Social Gatherings with Friends and Family:   . Attends Religious Services:   . Active Member of Clubs or Organizations:   . Attends Archivist Meetings:   Marland Kitchen Marital Status:   Intimate Partner Violence:   . Fear of  Current or Ex-Partner:   . Emotionally Abused:   Marland Kitchen Physically Abused:   . Sexually Abused:     Family History:  Family History  Problem Relation Age of Onset  . Osteopenia Mother   . Colon cancer Father 41  . Colon polyps Sister   . Brain cancer Cousin 25       maternal cousin  . Thyroid cancer Cousin 12  . Stomach cancer Maternal Grandfather   . Breast cancer Neg Hx      Physical Exam Vitals: BP 112/70   Pulse 84   Resp 18   Ht 5\' 6"  (1.676 m)   Wt 162 lb 6.4 oz (73.7 kg)   SpO2 98%   BMI 26.21 kg/m  Physical Exam  Constitutional: She is oriented to person, place, and time. She appears well-developed.  HENT:  Head: Normocephalic and atraumatic.  Neck: No thyromegaly present.  Cardiovascular: Normal rate and regular rhythm.  No murmur heard. Respiratory: Effort normal and breath sounds normal. She has no wheezes. She has no rales. She exhibits no tenderness. Right breast exhibits no inverted nipple, no mass, no nipple discharge, no skin change and no tenderness. Left breast exhibits no inverted nipple, no mass, no nipple discharge, no skin change and no tenderness.  GI: Soft. She exhibits no distension and no mass. There is no abdominal tenderness. There is no rebound and no guarding.  Genitourinary:    Genitourinary Comments: Vulva: no lesions or inflammation Vagina: Urethrocele (asymptomatic), no bleeding Cervix: no masses, lesions, NT Uterus: AV, NSSC, mobile, NT Adnexa: no masses, NT Rectal: external,non inflamed hemorrhoid   Musculoskeletal:        General: Normal range of motion.     Cervical back: Normal range of motion and neck supple.  Lymphadenopathy:    She has no cervical adenopathy.       Right: No supraclavicular adenopathy present.       Left: No supraclavicular adenopathy present.  No infraclavicular adenopathy  Neurological: She is alert and oriented to person, place, and time.  Skin: Skin is warm and dry. No rash noted.  Psychiatric: Her  behavior is normal. Judgment and thought content normal.     Assessment: 49 y.o.  well woman exam Mild depression Vasomotor symptoms of menopause   Plan: 1. Screening for cervical cancer: PAP not done. Desires Pap smears every 3 years. Next due in 2 years 2) Screening for breast cancer:  Mammogram ordered.  Encouraged self breast exams. 3). Colonoscopy up to date. Next due in 2025 4) Follow up in 1 year for annual. 5). Routine screening for cholesterol and diabetes done today 6) Prevention of osteoporosis discussed with adequate intake of calcium and vitamin D3. Also discussed the importance of strength training/exercise in preventing osteoporosis. DEXA scan ordered.     Dalia Heading, CNM

## 2020-01-17 LAB — HEMOGLOBIN A1C
Est. average glucose Bld gHb Est-mCnc: 111 mg/dL
Hgb A1c MFr Bld: 5.5 % (ref 4.8–5.6)

## 2020-01-17 LAB — LIPID PANEL WITH LDL/HDL RATIO
Cholesterol, Total: 231 mg/dL — ABNORMAL HIGH (ref 100–199)
HDL: 62 mg/dL (ref >39)
LDL Chol Calc (NIH): 148 mg/dL — ABNORMAL HIGH (ref 0–99)
LDL/HDL Ratio: 2.4 ratio (ref 0.0–3.2)
Triglycerides: 118 mg/dL (ref 0–149)
VLDL Cholesterol Cal: 21 mg/dL (ref 5–40)

## 2020-01-20 ENCOUNTER — Encounter: Payer: Self-pay | Admitting: Certified Nurse Midwife

## 2020-01-23 ENCOUNTER — Other Ambulatory Visit: Payer: BC Managed Care – PPO

## 2020-01-23 ENCOUNTER — Ambulatory Visit: Payer: BC Managed Care – PPO

## 2020-01-24 ENCOUNTER — Ambulatory Visit
Admission: RE | Admit: 2020-01-24 | Discharge: 2020-01-24 | Disposition: A | Discharge status: Home / Self Care | Payer: BC Managed Care – PPO | Source: Ambulatory Visit | Attending: Certified Nurse Midwife | Admitting: Certified Nurse Midwife

## 2020-01-24 ENCOUNTER — Other Ambulatory Visit: Payer: Self-pay

## 2020-01-24 DIAGNOSIS — Z13820 Encounter for screening for osteoporosis: Secondary | ICD-10-CM | POA: Diagnosis present

## 2020-01-24 DIAGNOSIS — Z1231 Encounter for screening mammogram for malignant neoplasm of breast: Secondary | ICD-10-CM | POA: Insufficient documentation

## 2020-05-12 IMAGING — MG DIGITAL SCREENING BILATERAL MAMMOGRAM WITH TOMO AND CAD
8 series · 9 of 24 positions shown · non-contrast
Comparison: Previous exam(s).

CLINICAL DATA: Screening.

EXAM:
DIGITAL SCREENING BILATERAL MAMMOGRAM WITH TOMO AND CAD

[L CC synth-2D]
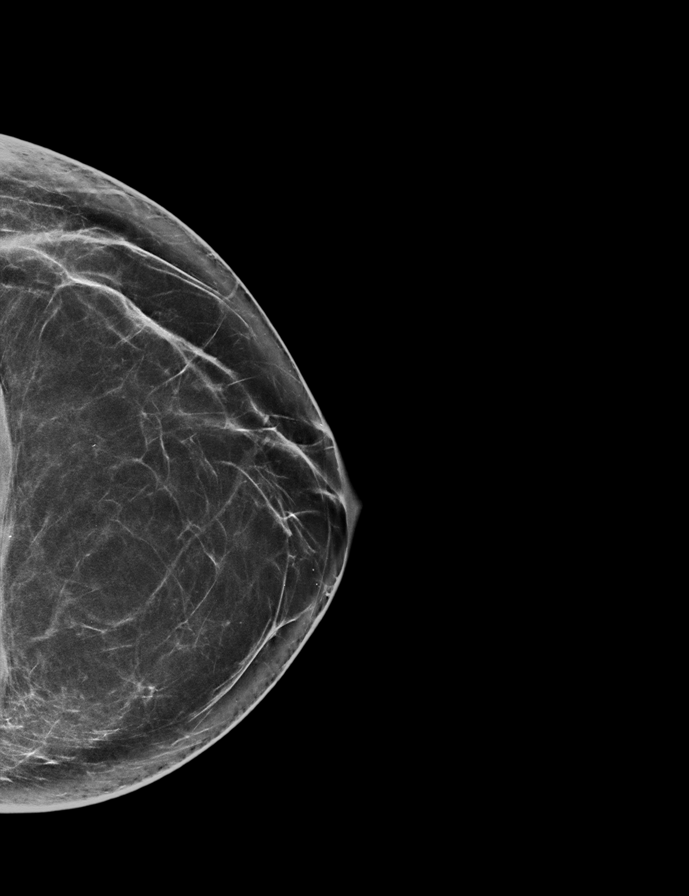

[R MLO synth-2D]
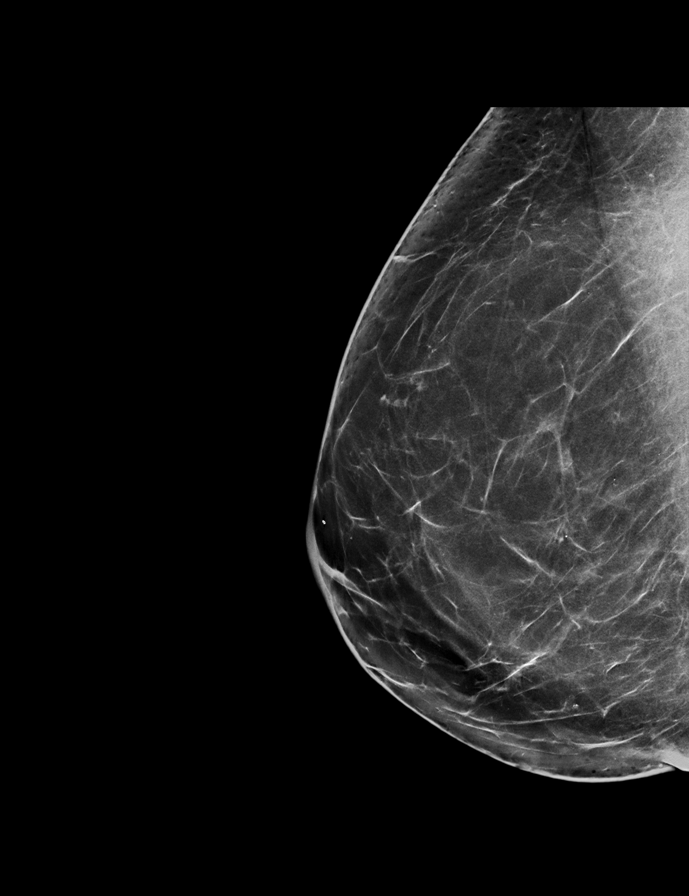

[R CC synth-2D]
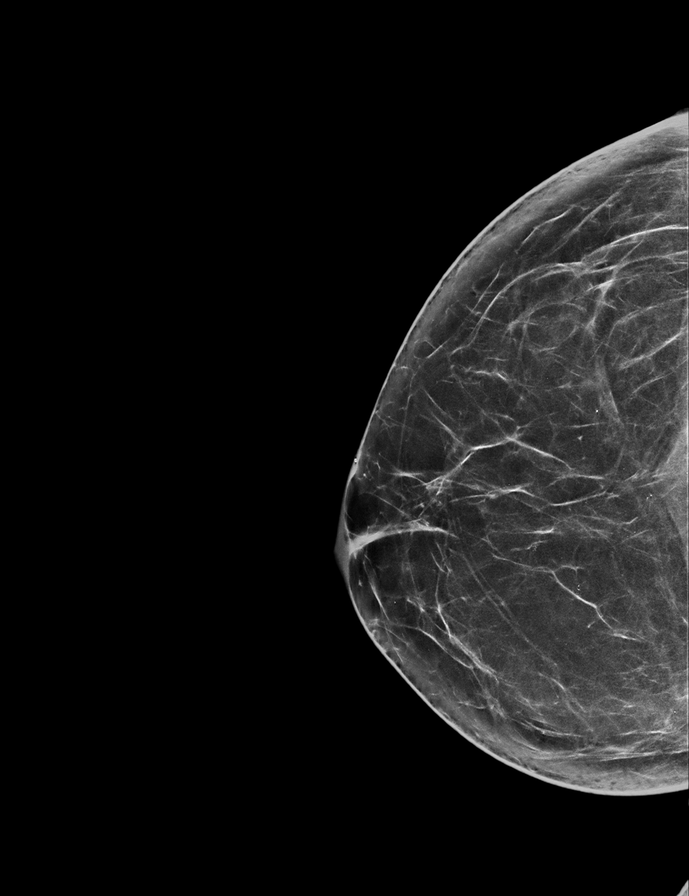

[L MLO synth-2D]
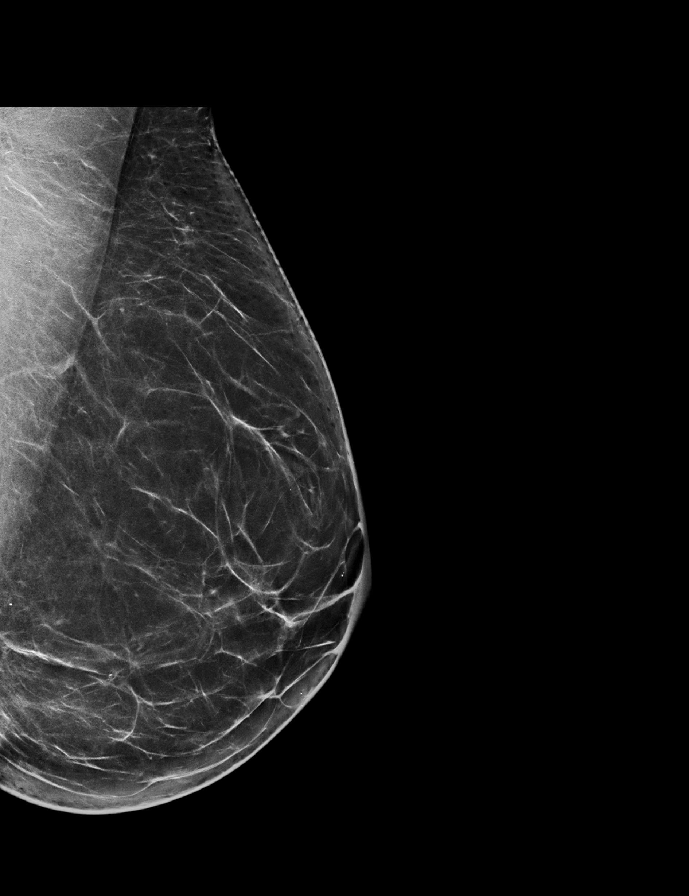

[R CC tomo · 2 of 67 frames shown]
[frame 22/67]
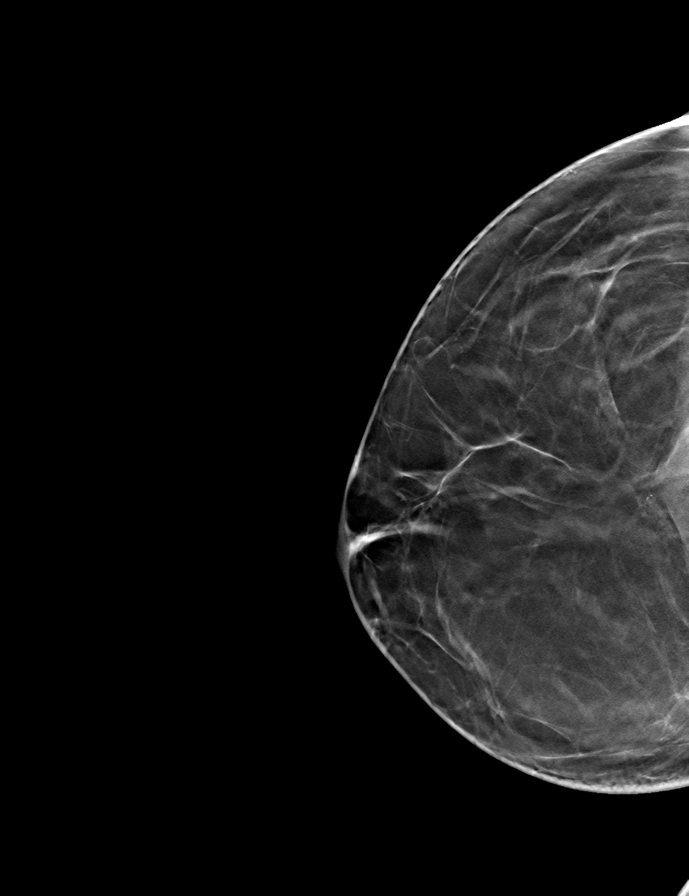
[frame 34/67]
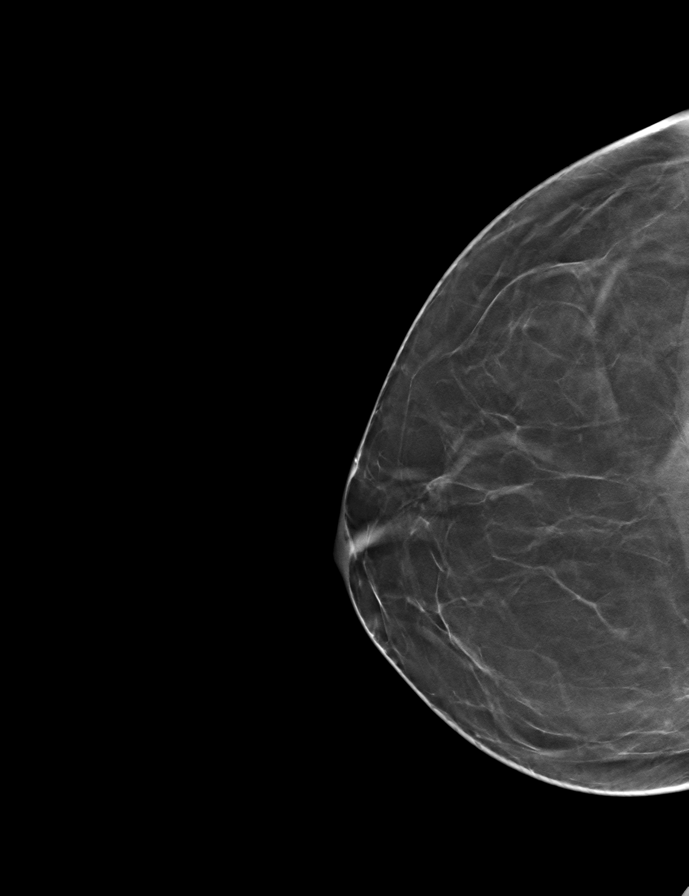

[L CC tomo · tomo slice 36/71.0]
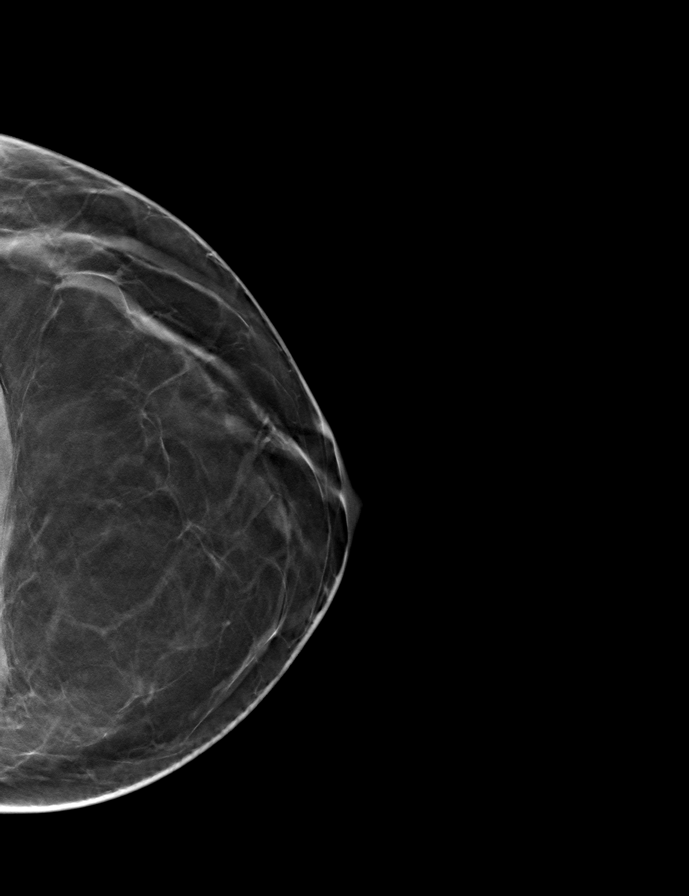

[L MLO tomo · tomo slice 35/70.0]
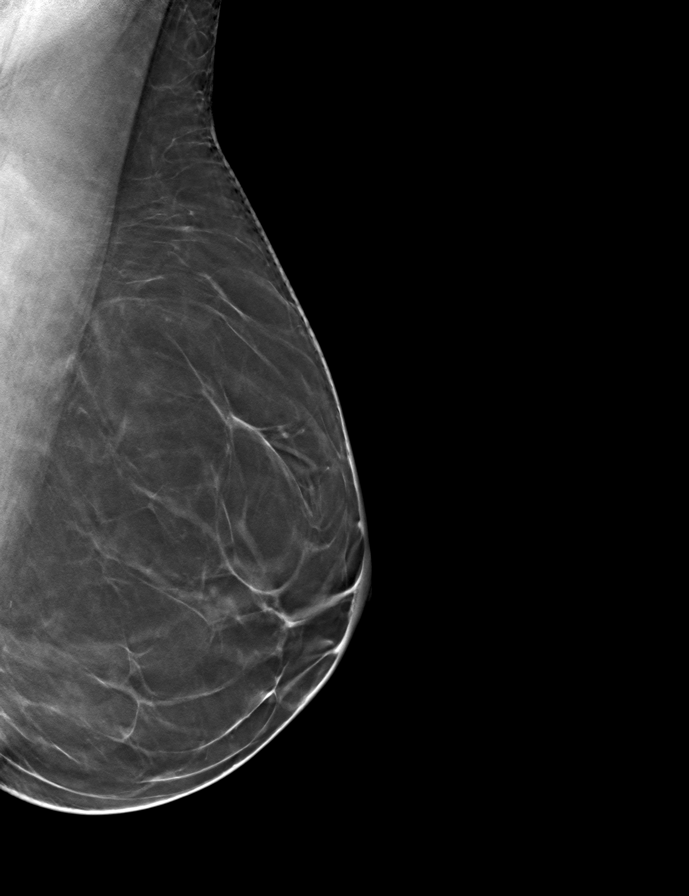

[R MLO tomo · tomo slice 35/70.0]
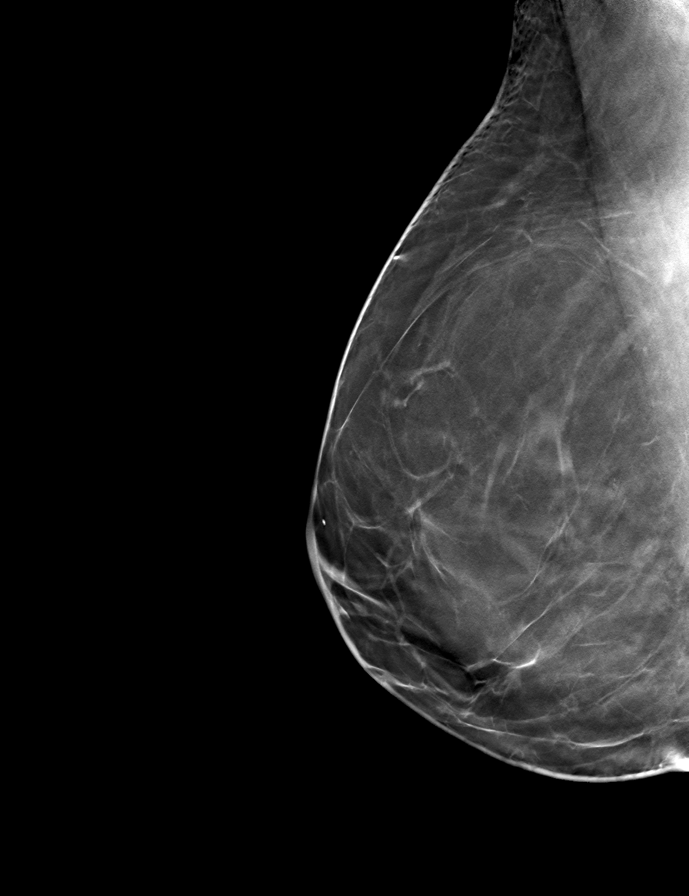

[9 of 24 positions shown; findings below may reference images not displayed]

ACR Breast Density Category b: There are scattered areas of
fibroglandular density.
FINDINGS: There are no findings suspicious for malignancy. Images were
processed with CAD.
IMPRESSION: No mammographic evidence of malignancy. A result letter of this
screening mammogram will be mailed directly to the patient.

RECOMMENDATION:
Screening mammogram in one year. (Code:[id number])

BI-RADS CATEGORY  1: Negative.

## 2021-01-21 ENCOUNTER — Encounter: Payer: Self-pay | Admitting: Advanced Practice Midwife

## 2021-01-21 ENCOUNTER — Other Ambulatory Visit: Payer: Self-pay

## 2021-01-21 ENCOUNTER — Ambulatory Visit (INDEPENDENT_AMBULATORY_CARE_PROVIDER_SITE_OTHER): Payer: BC Managed Care – PPO | Admitting: Advanced Practice Midwife

## 2021-01-21 VITALS — BP 125/75 | Ht 66.0 in | Wt 159.0 lb

## 2021-01-21 DIAGNOSIS — Z1239 Encounter for other screening for malignant neoplasm of breast: Secondary | ICD-10-CM | POA: Diagnosis not present

## 2021-01-21 DIAGNOSIS — Z01419 Encounter for gynecological examination (general) (routine) without abnormal findings: Secondary | ICD-10-CM

## 2021-01-21 NOTE — Progress Notes (Signed)
Gynecology Annual Exam  PCP: Sofie Hartigan, MD  Chief Complaint:  Chief Complaint  Patient presents with   Gynecologic Exam    Itching near clitoris that's on/off   Pelvic Pain    Right side only x few months    History of Present Illness:Patient is a 50 y.o. [id number] presents for annual exam. The patient has complaint today of right side pelvic cramping pain that occurs every 2-3 weeks and is constant for half a day. This has been going on for a few months. She has a history of polycystic ovaries. Yesterday she noticed a "cut" near her clitoris that was painful when washing. She is not currently sexually active and has no concerns for STDs. We discussed likely thinning and irritation of vaginal tissue due to postmenopause. She mentions her nails break more easily in the past year. Discussed having adequate protein in her diet and trying OTC products for hair and nails. Also encouraged her to have adequate calcium and vitamin D along with strength exercises for bone health. Bone Density scan diagnosis from last year is Osteopenia.   LMP: No LMP recorded (lmp unknown). Patient is postmenopausal.  Postcoital Bleeding: no Dysmenorrhea: not applicable  The patient is not currently sexually active. She denies dyspareunia.  The patient does perform self breast exams.  There is no notable family history of breast or ovarian cancer in her family.  The patient wears seatbelts: yes.   The patient has regular exercise: she walks for 1 hour daily, she admits a healthy diet, adequate hydration and about 6-7 hours of sleep nightly. 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   The patient denies current symptoms of depression.  She admits current dose of Prozac is correct.   Review of Systems: Review of Systems  Constitutional:  Negative for chills and fever.  HENT:  Negative for congestion, ear discharge, ear pain, hearing loss, sinus pain and sore throat.   Eyes:  Positive for  redness. Negative for blurred vision and double vision.  Respiratory:  Negative for cough, shortness of breath and wheezing.   Cardiovascular:  Negative for chest pain, palpitations and leg swelling.  Gastrointestinal:  Negative for abdominal pain, blood in stool, constipation, diarrhea, heartburn, melena, nausea and vomiting.  Genitourinary:  Negative for dysuria, flank pain, frequency, hematuria and urgency.  Musculoskeletal:  Negative for back pain, joint pain and myalgias.  Skin:  Negative for itching and rash.  Neurological:  Positive for dizziness. Negative for tingling, tremors, sensory change, speech change, focal weakness, seizures, loss of consciousness, weakness and headaches.  Endo/Heme/Allergies:  Negative for environmental allergies. Does not bruise/bleed easily.       Positive for occasional hot flashes  Psychiatric/Behavioral:  Positive for depression. Negative for hallucinations, memory loss, substance abuse and suicidal ideas. The patient is not nervous/anxious and does not have insomnia.    Past Medical History:  Patient Active Problem List   Diagnosis Date Noted   Adenomatous polyps 11/02/2017    On 2017 colonoscopy-next colonoscopy due at age 32.     Depression     Past Surgical History:  Past Surgical History:  Procedure Laterality Date   COLONOSCOPY W/ BIOPSIES  01/23/2019   hyperplastic polyps/ next due 2025   COLONOSCOPY WITH PROPOFOL N/A 11/26/2015   Procedure: COLONOSCOPY WITH PROPOFOL;  Surgeon: Manya Silvas, MD;  Location: [facility] ENDOSCOPY;  Service: Endoscopy;  Laterality: N/A;   CRYOTHERAPY  1992   REDUCTION MAMMAPLASTY  1993   WISDOM TOOTH EXTRACTION      Gynecologic History:  No LMP recorded (lmp unknown). Patient is postmenopausal. Last Pap: 2 years ago Results were:  no abnormalities  Last mammogram: 1 year ago Results were: BI-RAD I  Obstetric History: [id number]  Family History:  Family History  Problem Relation Age of Onset   Osteopenia  Mother    Colon cancer Father 78   Colon polyps Sister    Colon polyps Brother    Stomach cancer Maternal Grandfather    Brain cancer Cousin 25       maternal cousin   Thyroid cancer Cousin 65   Breast cancer Neg Hx     Social History:  Social History   Socioeconomic History   Marital status: Married    Spouse name: Not on file   Number of children: 4   Years of education: 16   Highest education level: Not on file  Occupational History   Occupation: Data processing manager  Tobacco Use   Smoking status: Never   Smokeless tobacco: Never  Vaping Use   Vaping Use: Never used  Substance and Sexual Activity   Alcohol use: Yes    Alcohol/week: 2.0 standard drinks    Types: 2 Glasses of wine per week   Drug use: No   Sexual activity: Not Currently    Partners: Male    Birth control/protection: Other-see comments    Comment: vasectomy  Other Topics Concern   Not on file  Social History Narrative   Not on file   Social Determinants of Health   Financial Resource Strain: Not on file  Food Insecurity: Not on file  Transportation Needs: Not on file  Physical Activity: Not on file  Stress: Not on file  Social Connections: Not on file  Intimate Partner Violence: Not on file    Allergies:  No Known Allergies  Medications: Prior to Admission medications   Medication Sig Start Date End Date Taking? Authorizing Provider  Acetaminophen 500 MG capsule Take by mouth.   Yes [provider]  Calcium Carbonate-Vitamin D 600-200 MG-UNIT TABS Take 1 tablet by mouth daily.   Yes [provider]  fexofenadine (ALLEGRA) 180 MG tablet Take 1 tablet (180 mg total) by mouth daily. 11/02/17  Yes Dalia Heading, CNM  FLUoxetine (PROZAC) 10 MG capsule Take by mouth. 08/30/19  Yes [provider]    Physical Exam Vitals: Blood pressure 125/75, height 5\' 6"  (1.676 m), weight 159 lb (72.1 kg).  General: NAD HEENT: normocephalic, anicteric Thyroid: no enlargement,  no palpable nodules Pulmonary: No increased work of breathing, CTAB Cardiovascular: RRR, distal pulses 2+ Breast: Breast symmetrical, no tenderness, no palpable nodules or masses, no skin or nipple retraction present, no nipple discharge.  No axillary or supraclavicular lymphadenopathy. Abdomen: NABS, soft, non-tender, non-distended.  Umbilicus without lesions.  No hepatomegaly, splenomegaly or masses palpable. No evidence of hernia  Genitourinary:  External: Normal external female genitalia.  Normal urethral meatus, normal Bartholin's and Skene's glands.    Vagina: Normal vaginal mucosa, no evidence of prolapse.    Cervix: no CMT  Uterus: Non-enlarged, mobile, normal contour.    Adnexa: ovaries non-enlarged, no adnexal masses, non-tender to palpation Extremities: no edema, erythema, or tenderness Neurologic: Grossly intact Psychiatric: mood appropriate, affect full    Assessment: 50 y.o. [id number] routine annual exam  Plan: Problem List Items Addressed This Visit   None Visit Diagnoses     Well woman exam with routine gynecological exam    -  Primary   Relevant Orders   MM DIGITAL SCREENING BILATERAL   Breast screening       Relevant Orders   MM DIGITAL SCREENING BILATERAL       1) Mammogram - recommend yearly screening mammogram.  Mammogram Was ordered today  2) STI screening  was offered and declined  3) ASCCP guidelines and rationale discussed.  Patient opts for every 3 years screening interval  4) Osteoporosis  - per USPTF routine screening DEXA at age 45 - FRAX 73 year major fracture risk 0.3%,  10 year hip fracture risk 4.9%  Consider FDA-approved medical therapies in postmenopausal women and men aged 57 years and older, based on the following: a) A hip or vertebral (clinical or morphometric) fracture b) T-score ? -2.5 at the femoral neck or spine after appropriate evaluation to exclude secondary causes C) Low bone mass (T-score between -1.0 and -2.5 at the femoral  neck or spine) and a 10-year probability of a hip fracture ? 3% or a 10-year probability of a major osteoporosis-related fracture ? 20% based on the [place]-adapted WHO algorithm   5) Routine healthcare maintenance including cholesterol, diabetes screening discussed Declines  6) Colonoscopy q 5 years, next due in 2025.  Screening recommended starting at age 73 for average risk individuals, age 76 for individuals deemed at increased risk (including African Americans) and recommended to continue until age 40.  For patient age 61-85 individualized approach is recommended.  Gold standard screening is via colonoscopy, Cologuard screening is an acceptable alternative for patient unwilling or unable to undergo colonoscopy.  "Colorectal cancer screening for average?risk adults: 2018 guideline update from the American Cancer Society"CA: A Cancer Journal for Clinicians: Dec 22, 2016   7) Consider pelvic imaging for worsening pain  8) Return in about 1 year (around 01/21/2022) for annual established gyn.    Christean Leaf, CNM [facility] 01/21/21, 10:01 AM

## 2021-01-21 NOTE — Patient Instructions (Signed)
Health Maintenance, Female Adopting a healthy lifestyle and getting preventive care are important in promoting health and wellness. Ask your health care provider about: The right schedule for you to have regular tests and exams. Things you can do on your own to prevent diseases and keep yourself healthy. What should I know about diet, weight, and exercise? Eat a healthy diet  Eat a diet that includes plenty of vegetables, fruits, low-fat dairy products, and lean protein. Do not eat a lot of foods that are high in solid fats, added sugars, or sodium.  Maintain a healthy weight Body mass index (BMI) is used to identify weight problems. It estimates body fat based on height and weight. Your health care provider can help determineyour BMI and help you achieve or maintain a healthy weight. Get regular exercise Get regular exercise. This is one of the most important things you can do for your health. Most adults should: Exercise for at least 150 minutes each week. The exercise should increase your heart rate and make you sweat (moderate-intensity exercise). Do strengthening exercises at least twice a week. This is in addition to the moderate-intensity exercise. Spend less time sitting. Even light physical activity can be beneficial. Watch cholesterol and blood lipids Have your blood tested for lipids and cholesterol at 52 years of age, then havethis test every 5 years. Have your cholesterol levels checked more often if: Your lipid or cholesterol levels are high. You are older than 52 years of age. You are at high risk for heart disease. What should I know about cancer screening? Depending on your health history and family history, you may need to have cancer screening at various ages. This may include screening for: Breast cancer. Cervical cancer. Colorectal cancer. Skin cancer. Lung cancer. What should I know about heart disease, diabetes, and high blood pressure? Blood pressure and heart  disease High blood pressure causes heart disease and increases the risk of stroke. This is more likely to develop in people who have high blood pressure readings, are of African descent, or are overweight. Have your blood pressure checked: Every 3-5 years if you are 18-39 years of age. Every year if you are 40 years old or older. Diabetes Have regular diabetes screenings. This checks your fasting blood sugar level. Have the screening done: Once every three years after age 40 if you are at a normal weight and have a low risk for diabetes. More often and at a younger age if you are overweight or have a high risk for diabetes. What should I know about preventing infection? Hepatitis B If you have a higher risk for hepatitis B, you should be screened for this virus. Talk with your health care provider to find out if you are at risk forhepatitis B infection. Hepatitis C Testing is recommended for: Everyone born from 1945 through 1965. Anyone with known risk factors for hepatitis C. Sexually transmitted infections (STIs) Get screened for STIs, including gonorrhea and chlamydia, if: You are sexually active and are younger than 52 years of age. You are older than 52 years of age and your health care provider tells you that you are at risk for this type of infection. Your sexual activity has changed since you were last screened, and you are at increased risk for chlamydia or gonorrhea. Ask your health care provider if you are at risk. Ask your health care provider about whether you are at high risk for HIV. Your health care provider may recommend a prescription medicine to help   prevent HIV infection. If you choose to take medicine to prevent HIV, you should first get tested for HIV. You should then be tested every 3 months for as long as you are taking the medicine. Pregnancy If you are about to stop having your period (premenopausal) and you may become pregnant, seek counseling before you get  pregnant. Take 400 to 800 micrograms (mcg) of folic acid every day if you become pregnant. Ask for birth control (contraception) if you want to prevent pregnancy. Osteoporosis and menopause Osteoporosis is a disease in which the bones lose minerals and strength with aging. This can result in bone fractures. If you are 65 years old or older, or if you are at risk for osteoporosis and fractures, ask your health care provider if you should: Be screened for bone loss. Take a calcium or vitamin D supplement to lower your risk of fractures. Be given hormone replacement therapy (HRT) to treat symptoms of menopause. Follow these instructions at home: Lifestyle Do not use any products that contain nicotine or tobacco, such as cigarettes, e-cigarettes, and chewing tobacco. If you need help quitting, ask your health care provider. Do not use street drugs. Do not share needles. Ask your health care provider for help if you need support or information about quitting drugs. Alcohol use Do not drink alcohol if: Your health care provider tells you not to drink. You are pregnant, may be pregnant, or are planning to become pregnant. If you drink alcohol: Limit how much you use to 0-1 drink a day. Limit intake if you are breastfeeding. Be aware of how much alcohol is in your drink. In the [place] one drink equals one 12 oz bottle of beer (355 mL), one 5 oz glass of wine (148 mL), or one 1 oz glass of hard liquor (44 mL). General instructions Schedule regular health, dental, and eye exams. Stay current with your vaccines. Tell your health care provider if: You often feel depressed. You have ever been abused or do not feel safe at home. Summary Adopting a healthy lifestyle and getting preventive care are important in promoting health and wellness. Follow your health care provider's instructions about healthy diet, exercising, and getting tested or screened for diseases. Follow your health care provider's  instructions on monitoring your cholesterol and blood pressure. This information is not intended to replace advice given to you by your health care provider. Make sure you discuss any questions you have with your healthcare provider. Document Revised: 07/05/2018 Document Reviewed: 07/05/2018 Elsevier Patient Education  2022 [employer]  

## 2021-03-06 ENCOUNTER — Other Ambulatory Visit: Payer: Self-pay | Admitting: Family Medicine

## 2021-03-06 DIAGNOSIS — Z1231 Encounter for screening mammogram for malignant neoplasm of breast: Secondary | ICD-10-CM

## 2021-04-06 ENCOUNTER — Ambulatory Visit
Admission: RE | Admit: 2021-04-06 | Discharge: 2021-04-06 | Disposition: A | Discharge status: Home / Self Care | Payer: BC Managed Care – PPO | Source: Ambulatory Visit | Attending: Family Medicine | Admitting: Family Medicine

## 2021-04-06 ENCOUNTER — Other Ambulatory Visit: Payer: Self-pay

## 2021-04-06 DIAGNOSIS — Z1231 Encounter for screening mammogram for malignant neoplasm of breast: Secondary | ICD-10-CM | POA: Insufficient documentation

## 2022-02-25 ENCOUNTER — Other Ambulatory Visit: Payer: Self-pay | Admitting: Family Medicine

## 2022-02-25 DIAGNOSIS — Z1231 Encounter for screening mammogram for malignant neoplasm of breast: Secondary | ICD-10-CM

## 2022-04-08 ENCOUNTER — Ambulatory Visit
Admission: RE | Admit: 2022-04-08 | Discharge: 2022-04-08 | Disposition: A | Discharge status: Home / Self Care | Payer: BC Managed Care – PPO | Source: Ambulatory Visit | Attending: Family Medicine | Admitting: Family Medicine

## 2022-04-08 DIAGNOSIS — Z1231 Encounter for screening mammogram for malignant neoplasm of breast: Secondary | ICD-10-CM | POA: Insufficient documentation

## 2022-04-30 ENCOUNTER — Other Ambulatory Visit: Payer: Self-pay

## 2022-04-30 DIAGNOSIS — R55 Syncope and collapse: Secondary | ICD-10-CM | POA: Insufficient documentation

## 2022-04-30 DIAGNOSIS — E876 Hypokalemia: Secondary | ICD-10-CM | POA: Insufficient documentation

## 2022-04-30 DIAGNOSIS — R8289 Other abnormal findings on cytological and histological examination of urine: Secondary | ICD-10-CM | POA: Insufficient documentation

## 2022-04-30 LAB — CBC
HCT: 35.3 % — ABNORMAL LOW (ref 36.0–46.0)
Hemoglobin: 11.6 g/dL — ABNORMAL LOW (ref 12.0–15.0)
MCH: 28.3 pg (ref 26.0–34.0)
MCHC: 32.9 g/dL (ref 30.0–36.0)
MCV: 86.1 fL (ref 80.0–100.0)
Platelets: 193 10*3/uL (ref 150–400)
RBC: 4.1 MIL/uL (ref 3.87–5.11)
RDW: 11.9 % (ref 11.5–15.5)
WBC: 4.6 10*3/uL (ref 4.0–10.5)
nRBC: 0 % (ref 0.0–0.2)

## 2022-04-30 LAB — BASIC METABOLIC PANEL
Anion gap: 10 (ref 5–15)
BUN: 16 mg/dL (ref 6–20)
CO2: 20 mmol/L — ABNORMAL LOW (ref 22–32)
Calcium: 8.7 mg/dL — ABNORMAL LOW (ref 8.9–10.3)
Chloride: 111 mmol/L (ref 98–111)
Creatinine, Ser: 0.78 mg/dL (ref 0.44–1.00)
GFR, Estimated: >60 mL/min (ref >60)
Glucose, Bld: 90 mg/dL (ref 70–99)
Potassium: 2.8 mmol/L — ABNORMAL LOW (ref 3.5–5.1)
Sodium: 141 mmol/L (ref 135–145)

## 2022-04-30 LAB — URINALYSIS, ROUTINE W REFLEX MICROSCOPIC
Bacteria, UA: NONE SEEN
Bilirubin Urine: NEGATIVE
Glucose, UA: NEGATIVE mg/dL
Ketones, ur: NEGATIVE mg/dL
Nitrite: NEGATIVE
Protein, ur: NEGATIVE mg/dL
Specific Gravity, Urine: 1.004 — ABNORMAL LOW (ref 1.005–1.030)
pH: 5 (ref 5.0–8.0)

## 2022-04-30 LAB — POC URINE PREG, ED: Preg Test, Ur: NEGATIVE

## 2022-04-30 NOTE — ED Notes (Signed)
Pt presents to ER via EMS with complaints of syncopal episode, pt reports she felt lightheaded before she passed out. Per EMS positive for Orthostatic hypotension BP while sitting 120/70 standing up 96/60.Pt received 534mNS by EMS

## 2022-04-30 NOTE — ED Triage Notes (Signed)
Pt arrived from [facility] after having two syncopal episodes at a restaurant tonight lasting 15 seconds each. Her husband reports he held her in his arms when she passed out so she would not fall to the ground. She had 2 beers after eating dinner tonight and suddenly felt dizzy. She was orthostatic with EMS. Pt currently A&Ox4, denies any pain.

## 2022-05-01 ENCOUNTER — Emergency Department
Admission: EM | Admit: 2022-05-01 | Discharge: 2022-05-01 | Disposition: A | Discharge status: Home / Self Care | Payer: BC Managed Care – PPO | Attending: Emergency Medicine | Admitting: Emergency Medicine

## 2022-05-01 DIAGNOSIS — R55 Syncope and collapse: Secondary | ICD-10-CM

## 2022-05-01 DIAGNOSIS — E876 Hypokalemia: Secondary | ICD-10-CM

## 2022-05-01 LAB — MAGNESIUM: Magnesium: 2.2 mg/dL (ref 1.7–2.4)

## 2022-05-01 LAB — D-DIMER, QUANTITATIVE: D-Dimer, Quant: <0.27 ug/mL-FEU (ref 0.00–0.50)

## 2022-05-01 MED ORDER — POTASSIUM CHLORIDE 10 MEQ/100ML IV SOLN
10.0000 meq | INTRAVENOUS | Status: AC
  Administered 2022-05-01: 10 meq via INTRAVENOUS
  Filled 2022-05-01 (×2): qty 100

## 2022-05-01 MED ORDER — POTASSIUM CHLORIDE CRYS ER 20 MEQ PO TBCR
40.0000 meq | EXTENDED_RELEASE_TABLET | Freq: Once | ORAL | Status: AC
  Administered 2022-05-01: 40 meq via ORAL
  Filled 2022-05-01: qty 2

## 2022-05-01 MED ORDER — LACTATED RINGERS IV BOLUS
1000.0000 mL | Freq: Once | INTRAVENOUS | Status: AC
  Administered 2022-05-01: 1000 mL via INTRAVENOUS

## 2022-05-01 NOTE — ED Provider Notes (Signed)
[facility] Provider Note    Event Date/Time   First MD Initiated Contact with Patient 05/01/22 0017     (approximate)   History   Loss of Consciousness   HPI  Christine Wade is a 51 y.o. female who presents to the ED for evaluation of Loss of Consciousness   I reviewed a PCP visit from 9/13.  History of depression, anxiety, HLD  Patient presents to the ED, accompanied by her husband and daughter, for evaluation of 2 episodes of syncope that occurred this evening just prior to arrival.  She was out to dinner with her family, seated at a high top table when she began feeling flushed and presyncopal dizziness.  Husband and patient report that she passed out for perhaps 15 seconds before immediately began a consciousness without any postictal period.  No seizure activity or falls to the ground.  Husband kept her upright.  EMS was called and assessed her while she was still supine, they stood her up and she apparently had another syncopal episode associated with presyncopal dizziness.  EMS reports orthostasis and orthostatic vital signs alongside this with a drop in her blood pressure.  Patient reports a generalized sensation of doom or feeling weird overall since then.  She reports going for a walk outside this afternoon without any symptoms, felt fine without exertional dyspnea or chest discomfort.  No other episodes of syncope or illnesses recently.  She had x2 5% ABV beers with dinner.  Husband and daughter acknowledge that she has been seen in the normal since they have been here in the ER waiting for the past couple hours.  Physical Exam   Triage Vital Signs: ED Triage Vitals  Enc Vitals Group     BP 04/30/22 2053 115/79     Pulse Rate 04/30/22 2053 70     Resp 04/30/22 2053 18     Temp 04/30/22 2053 98.2 F (36.8 C)     Temp Source 04/30/22 2053 Oral     SpO2 04/30/22 2053 100 %     Weight 04/30/22 2058 158 lb (71.7 kg)     Height 04/30/22 2058  '5\' 6"'$  (1.676 m)     Head Circumference --      Peak Flow --      Pain Score 04/30/22 2058 0     Pain Loc --      Pain Edu? --      Excl. in [place]? --     Most recent vital signs: Vitals:   04/30/22 2053 05/01/22 0100  BP: 115/79 133/85  Pulse: 70 77  Resp: 18 11  Temp: 98.2 F (36.8 C)   SpO2: 100% 100%    General: Awake, no distress.  CV:  Good peripheral perfusion.  Resp:  Normal effort.  Abd:  No distention.  MSK:  No deformity noted.  Neuro:  No focal deficits appreciated. Other:     ED Results / Procedures / Treatments   Labs (all labs ordered are listed, but only abnormal results are displayed) Labs Reviewed  BASIC METABOLIC PANEL - Abnormal; Notable for the following components:      Result Value   Potassium 2.8 (*)    CO2 20 (*)    Calcium 8.7 (*)    All other components within normal limits  CBC - Abnormal; Notable for the following components:   Hemoglobin 11.6 (*)    HCT 35.3 (*)    All other components within normal limits  URINALYSIS,  ROUTINE W REFLEX MICROSCOPIC - Abnormal; Notable for the following components:   Color, Urine STRAW (*)    APPearance CLEAR (*)    Specific Gravity, Urine 1.004 (*)    Hgb urine dipstick MODERATE (*)    Leukocytes,Ua MODERATE (*)    All other components within normal limits  D-DIMER, QUANTITATIVE  MAGNESIUM  POC URINE PREG, ED  CBG MONITORING, ED    EKG Sinus rhythm with a rate of 71 bpm.  Normal axis.  First-degree AV block with PR interval 278 ms.  Otherwise normal intervals.  No clear signs of acute ischemia.  RADIOLOGY   Official radiology report(s): No results found.  PROCEDURES and INTERVENTIONS:  .1-3 Lead EKG Interpretation  Performed by: Vladimir Crofts, MD Authorized by: Vladimir Crofts, MD     Interpretation: normal     ECG rate:  80   ECG rate assessment: normal     Rhythm: sinus rhythm     Ectopy: none     Conduction: normal     Medications  potassium chloride 10 mEq in 100 mL IVPB (10 mEq  Intravenous New Bag/Given 05/01/22 0101)  lactated ringers bolus 1,000 mL (1,000 mLs Intravenous New Bag/Given 05/01/22 0059)  potassium chloride SA (KLOR-CON M) CR tablet 40 mEq (40 mEq Oral Given 05/01/22 0101)     IMPRESSION / MDM / ASSESSMENT AND PLAN / ED COURSE  I reviewed the triage vital signs and the nursing notes.  Differential diagnosis includes, but is not limited to, cardiac dysrhythmia, vasovagal syndrome, seizure, PE  {Patient presents with symptoms of an acute illness or injury that is potentially life-threatening.  Generally healthy 51 year old woman presents to the ED after a syncopal episode that I suspect is more related to relative dehydration and vasovagal episode with orthostasis and ultimately suitable for outpatient management.  She was orthostatic with EMS, but has normal vital signs here in the ED.  Blood work is noted to have hypokalemia, but normal magnesium.  No leukocytosis.  Urine with moderate leukocytes, but she has no urinary symptoms and I doubt acute cystitis contributing to her symptoms.  She has a negative D-dimer and no chest discomfort and I doubt PE or indications for advanced imaging of the chest.  After fluid rehydration as well as potassium repletion, she has resolution of symptoms and feeling well without dysrhythmias on the monitor.  I considered observation admission, but ultimately we will discharge with close return precautions.  Clinical Course as of 05/01/22 1219  Sat May 01, 2022  0203 Reassessed.  Feeling better.  We discussed reassuring D-dimer and work-up overall.  Shared decision making yields plan to pursue outpatient management. [DS]    Clinical Course User Index [DS] Vladimir Crofts, MD     FINAL CLINICAL IMPRESSION(S) / ED DIAGNOSES   Final diagnoses:  Syncope and collapse  Hypokalemia     Rx / DC Orders   ED Discharge Orders     None        Note:  This document was prepared using Dragon voice recognition software and  may include unintentional dictation errors.   Vladimir Crofts, MD 05/01/22 [phone]

## 2022-05-24 DIAGNOSIS — R0602 Shortness of breath: Secondary | ICD-10-CM | POA: Insufficient documentation

## 2022-05-24 DIAGNOSIS — R55 Syncope and collapse: Secondary | ICD-10-CM | POA: Insufficient documentation

## 2022-05-24 DIAGNOSIS — E785 Hyperlipidemia, unspecified: Secondary | ICD-10-CM | POA: Insufficient documentation

## 2022-08-19 ENCOUNTER — Ambulatory Visit (INDEPENDENT_AMBULATORY_CARE_PROVIDER_SITE_OTHER): Payer: BC Managed Care – PPO | Admitting: Advanced Practice Midwife

## 2022-08-19 ENCOUNTER — Encounter: Payer: Self-pay | Admitting: Advanced Practice Midwife

## 2022-08-19 VITALS — BP 100/60 | Ht 60.5 in | Wt 164.0 lb

## 2022-08-19 DIAGNOSIS — Z01419 Encounter for gynecological examination (general) (routine) without abnormal findings: Secondary | ICD-10-CM

## 2022-08-19 DIAGNOSIS — Z0000 Encounter for general adult medical examination without abnormal findings: Secondary | ICD-10-CM

## 2022-08-19 NOTE — Patient Instructions (Signed)
Mediterranean Diet A Mediterranean diet refers to food and lifestyle choices that are based on the traditions of countries located on the [employer]. It focuses on eating more fruits, vegetables, whole grains, beans, nuts, seeds, and heart-healthy fats, and eating less dairy, meat, eggs, and processed foods with added sugar, salt, and fat. This way of eating has been shown to help prevent certain conditions and improve outcomes for people who have chronic diseases, like kidney disease and heart disease. What are tips for following this plan? Reading food labels Check the serving size of packaged foods. For foods such as rice and pasta, the serving size refers to the amount of cooked product, not dry. Check the total fat in packaged foods. Avoid foods that have saturated fat or trans fats. Check the ingredient list for added sugars, such as corn syrup. Shopping  Buy a variety of foods that offer a balanced diet, including: Fresh fruits and vegetables (produce). Grains, beans, nuts, and seeds. Some of these may be available in unpackaged forms or large amounts (in bulk). Fresh seafood. Poultry and eggs. Low-fat dairy products. Buy whole ingredients instead of prepackaged foods. Buy fresh fruits and vegetables in-season from local farmers markets. Buy plain frozen fruits and vegetables. If you do not have access to quality fresh seafood, buy precooked frozen shrimp or canned fish, such as tuna, salmon, or sardines. Stock your pantry so you always have certain foods on hand, such as olive oil, canned tuna, canned tomatoes, rice, pasta, and beans. Cooking Cook foods with extra-virgin olive oil instead of using butter or other vegetable oils. Have meat as a side dish, and have vegetables or grains as your main dish. This means having meat in small portions or adding small amounts of meat to foods like pasta or stew. Use beans or vegetables instead of meat in common dishes like chili or  lasagna. Experiment with different cooking methods. Try roasting, broiling, steaming, and sauting vegetables. Add frozen vegetables to soups, stews, pasta, or rice. Add nuts or seeds for added healthy fats and plant protein at each meal. You can add these to yogurt, salads, or vegetable dishes. Marinate fish or vegetables using olive oil, lemon juice, garlic, and fresh herbs. Meal planning Plan to eat one vegetarian meal one day each week. Try to work up to two vegetarian meals, if possible. Eat seafood two or more times a week. Have healthy snacks readily available, such as: Vegetable sticks with hummus. Greek yogurt. Fruit and nut trail mix. Eat balanced meals throughout the week. This includes: Fruit: 2-3 servings a day. Vegetables: 4-5 servings a day. Low-fat dairy: 2 servings a day. Fish, poultry, or lean meat: 1 serving a day. Beans and legumes: 2 or more servings a week. Nuts and seeds: 1-2 servings a day. Whole grains: 6-8 servings a day. Extra-virgin olive oil: 3-4 servings a day. Limit red meat and sweets to only a few servings a month. Lifestyle  Cook and eat meals together with your family, when possible. Drink enough fluid to keep your urine pale yellow. Be physically active every day. This includes: Aerobic exercise like running or swimming. Leisure activities like gardening, walking, or housework. Get 7-8 hours of sleep each night. If recommended by your health care provider, drink red wine in moderation. This means 1 glass a day for nonpregnant women and 2 glasses a day for men. A glass of wine equals 5 oz (150 mL). What foods should I eat? Fruits Apples. Apricots. Avocado. Berries. Bananas. Cherries. Dates.  Figs. Grapes. Lemons. Melon. Oranges. Peaches. Plums. Pomegranate. ?Vegetables ?Artichokes. Beets. Broccoli. Cabbage. Carrots. Eggplant. Green beans. Chard. Kale. Spinach. Onions. Leeks. Peas. Squash. Tomatoes. Peppers. Radishes. ?Grains ?Whole-grain pasta. Brown  rice. Bulgur wheat. Polenta. Couscous. Whole-wheat bread. Oatmeal. Quinoa. ?Meats and other proteins ?Beans. Almonds. Sunflower seeds. Pine nuts. Peanuts. Cod. Salmon. Scallops. Shrimp. Tuna. Tilapia. Clams. Oysters. Eggs. Poultry without skin. ?Dairy ?Low-fat milk. Cheese. Greek yogurt. ?Fats and oils ?Extra-virgin olive oil. Avocado oil. Grapeseed oil. ?Beverages ?Water. Red wine. Herbal tea. ?Sweets and desserts ?Greek yogurt with honey. Baked apples. Poached pears. Trail mix. ?Seasonings and condiments ?Basil. Cilantro. Coriander. Cumin. Mint. Parsley. Sage. Rosemary. Tarragon. Garlic. Oregano. Thyme. Pepper. Balsamic vinegar. Tahini. Hummus. Tomato sauce. Olives. Mushrooms. ?The items listed above may not be a complete list of foods and beverages you can eat. Contact a dietitian for more information. ?What foods should I limit? ?This is a list of foods that should be eaten rarely or only on special occasions. ?Fruits ?Fruit canned in syrup. ?Vegetables ?Deep-fried potatoes (french fries). ?Grains ?Prepackaged pasta or rice dishes. Prepackaged cereal with added sugar. Prepackaged snacks with added sugar. ?Meats and other proteins ?Beef. Pork. Lamb. Poultry with skin. Hot dogs. Bacon. ?Dairy ?Ice cream. Sour cream. Whole milk. ?Fats and oils ?Butter. Canola oil. Vegetable oil. Beef fat (tallow). Lard. ?Beverages ?Juice. Sugar-sweetened soft drinks. Beer. Liquor and spirits. ?Sweets and desserts ?Cookies. Cakes. Pies. Candy. ?Seasonings and condiments ?Mayonnaise. Pre-made sauces and marinades. ?The items listed above may not be a complete list of foods and beverages you should limit. Contact a dietitian for more information. ?Summary ?The Mediterranean diet includes both food and lifestyle choices. ?Eat a variety of fresh fruits and vegetables, beans, nuts, seeds, and whole grains. ?Limit the amount of red meat and sweets that you eat. ?If recommended by your health care provider, drink red wine in moderation.  This means 1 glass a day for nonpregnant women and 2 glasses a day for men. A glass of wine equals 5 oz (150 mL). ?This information is not intended to replace advice given to you by your health care provider. Make sure you discuss any questions you have with your health care provider. ?Document Revised: 08/17/2019 Document Reviewed: 06/14/2019 ?Elsevier Patient Education ? 2023 [employer] ? ?

## 2022-08-19 NOTE — Progress Notes (Signed)
[facility]  Gynecology Annual Exam  PCP: Sofie Hartigan, MD  Chief Complaint:  Chief Complaint  Patient presents with   Annual Exam    History of Present Illness:Patient is a 52 y.o. [id number] presents for annual exam. The patient has complaint today of hair thinning more in the past year. Her most recent bone density result is osteoporosis and she is taking Fosamax.  LMP: No LMP recorded (lmp unknown). Patient is postmenopausal.   The patient is not sexually active. She denies dyspareunia.  The patient does perform self breast exams.  There is no notable family history of breast or ovarian cancer in her family.  The patient wears seatbelts: yes.   The patient has regular exercise:  she does some walking and working out- limited by knee issues, she thinks overall her diet is ok and she hydrates well, she doesn't usually get a full night's sleep .    The patient  admits  current "borderline" symptoms of depression and is under the care of her PCP for medication.     Review of Systems: Review of Systems  Constitutional:  Positive for malaise/fatigue. Negative for chills and fever.  HENT:  Negative for congestion, ear discharge, ear pain, hearing loss, sinus pain and sore throat.   Eyes:  Negative for blurred vision and double vision.  Respiratory:  Negative for cough, shortness of breath and wheezing.   Cardiovascular:  Negative for chest pain, palpitations and leg swelling.  Gastrointestinal:  Negative for abdominal pain, blood in stool, constipation, diarrhea, heartburn, melena, nausea and vomiting.  Genitourinary:  Negative for dysuria, flank pain, frequency, hematuria and urgency.  Musculoskeletal:  Positive for joint pain. Negative for back pain and myalgias.  Skin:  Negative for itching and rash.  Neurological:  Negative for dizziness, tingling, tremors, sensory change, speech change, focal weakness, seizures, loss of consciousness, weakness and headaches.   Endo/Heme/Allergies:  Negative for environmental allergies. Does not bruise/bleed easily.  Psychiatric/Behavioral:  Positive for depression. Negative for hallucinations, memory loss, substance abuse and suicidal ideas. The patient is not nervous/anxious and does not have insomnia.     Past Medical History:  Patient Active Problem List   Diagnosis Date Noted   Hyperlipidemia 05/24/2022   SOB (shortness of breath) on exertion 05/24/2022   Vasovagal syncope 05/24/2022   Adenomatous polyps 11/02/2017    On 2017 colonoscopy-next colonoscopy due at age 85.    Depression     Past Surgical History:  Past Surgical History:  Procedure Laterality Date   COLONOSCOPY W/ BIOPSIES  01/23/2019   hyperplastic polyps/ next due 2025   COLONOSCOPY WITH PROPOFOL N/A 11/26/2015   Procedure: COLONOSCOPY WITH PROPOFOL;  Surgeon: Manya Silvas, MD;  Location: [facility];  Service: Endoscopy;  Laterality: N/A;   [place] EXTRACTION      Gynecologic History:  No LMP recorded (lmp unknown). Patient is postmenopausal. Last Pap: 2020 Results were:  no abnormalities  Last mammogram: 2023 Results were: BI-RAD I  Obstetric History: [id number]  Family History:  Family History  Problem Relation Age of Onset   Osteopenia Mother    Colon cancer Father 54   Colon polyps Sister    Colon polyps Brother    Stomach cancer Maternal Grandfather    Brain cancer Cousin 25       maternal cousin   Thyroid cancer Cousin 32   Breast cancer Neg Hx     Social  History:  Social History   Socioeconomic History   Marital status: Married    Spouse name: Not on file   Number of children: 4   Years of education: 16   Highest education level: Not on file  Occupational History   Occupation: Teaching asistant  Tobacco Use   Smoking status: Never   Smokeless tobacco: Never  Vaping Use   Vaping Use: Never used  Substance and Sexual Activity   Alcohol use:  Yes    Alcohol/week: 2.0 standard drinks of alcohol    Types: 2 Glasses of wine per week   Drug use: No   Sexual activity: Not Currently    Partners: Male    Birth control/protection: Other-see comments    Comment: vasectomy  Other Topics Concern   Not on file  Social History Narrative   Not on file   Social Determinants of Health   Financial Resource Strain: Not on file  Food Insecurity: Not on file  Transportation Needs: Not on file  Physical Activity: Sufficiently Active (01/12/2019)   Exercise Vital Sign    Days of Exercise per Week: 7 days    Minutes of Exercise per Session: 60 min  Recent Concern: Physical Activity - Insufficiently Active (01/11/2019)   Exercise Vital Sign    Days of Exercise per Week: 3 days    Minutes of Exercise per Session: 30 min  Stress: No Stress Concern Present (01/11/2019)   [place]    Feeling of Stress : Only a little  Social Connections: Not on file  Intimate Partner Violence: Not on file    Allergies:  No Known Allergies  Medications: Prior to Admission medications   Medication Sig Start Date End Date Taking? Authorizing Provider  Acetaminophen 500 MG capsule Take by mouth.   Yes [provider]  alendronate (FOSAMAX) 70 MG tablet Take 70 mg by mouth once a week.   Yes [provider]  amoxicillin (AMOXIL) 875 MG tablet Take 875 mg by mouth 2 (two) times daily. 08/13/22  Yes [provider]  sertraline (ZOLOFT) 25 MG tablet Take 1 tablet by mouth daily. 06/21/22  Yes [provider]    Physical Exam Vitals: Blood pressure 100/60, height 5' 0.5" (1.537 m), weight 164 lb (74.4 kg).  General: NAD HEENT: normocephalic, anicteric Thyroid: no enlargement, no palpable nodules Pulmonary: No increased work of breathing, CTAB Cardiovascular: RRR, distal pulses 2+ Breast: Breast symmetrical, no tenderness, no palpable nodules or masses, no  skin or nipple retraction present, no nipple discharge.  No axillary or supraclavicular lymphadenopathy. Abdomen: NABS, soft, non-tender, non-distended.  Umbilicus without lesions.  No hepatomegaly, splenomegaly or masses palpable. No evidence of hernia  Genitourinary: deferred for no concerns/PAP interval Extremities: no edema, erythema, or tenderness Neurologic: Grossly intact Psychiatric: mood appropriate, affect full    Assessment: 52 y.o. [id number] routine annual exam  Plan: Problem List Items Addressed This Visit   None Visit Diagnoses     Well woman exam without gynecological exam    -  Primary       1) Mammogram - recommend yearly screening mammogram.  Mammogram Is up to date  2) STI screening  was not offered and therefore not obtained  3) ASCCP guidelines and rationale discussed.  Patient opts for every 5 years screening interval  4) Osteoporosis  - per USPTF routine screening DEXA at age 74  Consider FDA-approved medical therapies in postmenopausal women and men aged 35 years and  older, based on the following: a) A hip or vertebral (clinical or morphometric) fracture b) T-score ? -2.5 at the femoral neck or spine after appropriate evaluation to exclude secondary causes C) Low bone mass (T-score between -1.0 and -2.5 at the femoral neck or spine) and a 10-year probability of a hip fracture ? 3% or a 10-year probability of a major osteoporosis-related fracture ? 20% based on the [place]-adapted WHO algorithm  5) Routine healthcare maintenance including cholesterol, diabetes screening discussed managed by PCP  6) Colonoscopy is up to date.  Screening recommended starting at age 60 for average risk individuals, age 44 for individuals deemed at increased risk (including African Americans) and recommended to continue until age 15.  For patient age 83-85 individualized approach is recommended.  Gold standard screening is via colonoscopy, Cologuard screening is an acceptable  alternative for patient unwilling or unable to undergo colonoscopy.  "Colorectal cancer screening for average?risk adults: 2018 guideline update from the American Cancer Society"[place]: A Cancer Journal for Clinicians: Dec 22, 2016   7) Increase healthy lifestyle: diet, exercise, sleep, vitamin D3 supplement  8) Return in about 1 year (around 08/20/2023) for annual established gyn.   Christean Leaf, CNM [facility] 08/19/22, 5:01 PM

## 2023-03-08 ENCOUNTER — Other Ambulatory Visit: Payer: Self-pay | Admitting: Family Medicine

## 2023-03-08 DIAGNOSIS — Z1231 Encounter for screening mammogram for malignant neoplasm of breast: Secondary | ICD-10-CM

## 2023-04-11 ENCOUNTER — Ambulatory Visit
Admission: RE | Admit: 2023-04-11 | Discharge: 2023-04-11 | Disposition: A | Discharge status: Home / Self Care | Payer: BC Managed Care – PPO | Source: Ambulatory Visit | Attending: Family Medicine | Admitting: Family Medicine

## 2023-04-11 DIAGNOSIS — Z1231 Encounter for screening mammogram for malignant neoplasm of breast: Secondary | ICD-10-CM | POA: Insufficient documentation

## 2023-06-26 DIAGNOSIS — Z419 Encounter for procedure for purposes other than remedying health state, unspecified: Secondary | ICD-10-CM | POA: Diagnosis not present

## 2023-07-27 DIAGNOSIS — Z419 Encounter for procedure for purposes other than remedying health state, unspecified: Secondary | ICD-10-CM | POA: Diagnosis not present

## 2023-08-27 DIAGNOSIS — Z419 Encounter for procedure for purposes other than remedying health state, unspecified: Secondary | ICD-10-CM | POA: Diagnosis not present

## 2023-09-24 DIAGNOSIS — Z419 Encounter for procedure for purposes other than remedying health state, unspecified: Secondary | ICD-10-CM | POA: Diagnosis not present

## 2023-11-05 DIAGNOSIS — Z419 Encounter for procedure for purposes other than remedying health state, unspecified: Secondary | ICD-10-CM | POA: Diagnosis not present

## 2023-12-05 DIAGNOSIS — Z419 Encounter for procedure for purposes other than remedying health state, unspecified: Secondary | ICD-10-CM | POA: Diagnosis not present

## 2024-01-05 DIAGNOSIS — Z419 Encounter for procedure for purposes other than remedying health state, unspecified: Secondary | ICD-10-CM | POA: Diagnosis not present

## 2024-02-04 DIAGNOSIS — Z419 Encounter for procedure for purposes other than remedying health state, unspecified: Secondary | ICD-10-CM | POA: Diagnosis not present

## 2024-03-06 DIAGNOSIS — Z419 Encounter for procedure for purposes other than remedying health state, unspecified: Secondary | ICD-10-CM | POA: Diagnosis not present

## 2024-04-06 DIAGNOSIS — Z419 Encounter for procedure for purposes other than remedying health state, unspecified: Secondary | ICD-10-CM | POA: Diagnosis not present
